# Patient Record
Sex: Female | Born: 1965 | Race: Black or African American | Hispanic: No | Smoking: Never smoker
Health system: Southern US, Community
[De-identification: ages and names within clinical notes are randomized; demographics above are authoritative.]

## PROBLEM LIST (undated history)

## (undated) DIAGNOSIS — E785 Hyperlipidemia, unspecified: Secondary | ICD-10-CM

## (undated) DIAGNOSIS — R002 Palpitations: Secondary | ICD-10-CM

## (undated) DIAGNOSIS — F419 Anxiety disorder, unspecified: Secondary | ICD-10-CM

## (undated) DIAGNOSIS — G4733 Obstructive sleep apnea (adult) (pediatric): Secondary | ICD-10-CM

## (undated) DIAGNOSIS — E876 Hypokalemia: Secondary | ICD-10-CM

## (undated) DIAGNOSIS — F329 Major depressive disorder, single episode, unspecified: Secondary | ICD-10-CM

## (undated) DIAGNOSIS — F4310 Post-traumatic stress disorder, unspecified: Secondary | ICD-10-CM

## (undated) DIAGNOSIS — M797 Fibromyalgia: Secondary | ICD-10-CM

## (undated) DIAGNOSIS — F32A Depression, unspecified: Secondary | ICD-10-CM

## (undated) DIAGNOSIS — E119 Type 2 diabetes mellitus without complications: Secondary | ICD-10-CM

## (undated) DIAGNOSIS — I10 Essential (primary) hypertension: Secondary | ICD-10-CM

## (undated) HISTORY — DX: Obstructive sleep apnea (adult) (pediatric): G47.33

## (undated) HISTORY — DX: Type 2 diabetes mellitus without complications: E11.9

## (undated) HISTORY — DX: Major depressive disorder, single episode, unspecified: F32.9

## (undated) HISTORY — PX: CHOLECYSTECTOMY: SHX55

## (undated) HISTORY — DX: Hyperlipidemia, unspecified: E78.5

## (undated) HISTORY — DX: Essential (primary) hypertension: I10

## (undated) HISTORY — PX: LAPAROSCOPIC GASTRIC RESTRICTIVE DUODENAL PROCEDURE (DUODENAL SWITCH): SHX6667

## (undated) HISTORY — DX: Palpitations: R00.2

## (undated) HISTORY — DX: Depression, unspecified: F32.A

## (undated) HISTORY — PX: BREAST REDUCTION SURGERY: SHX8

## (undated) HISTORY — DX: Fibromyalgia: M79.7

## (undated) HISTORY — DX: Hypokalemia: E87.6

## (undated) HISTORY — DX: Anxiety disorder, unspecified: F41.9

## (undated) HISTORY — DX: Post-traumatic stress disorder, unspecified: F43.10

---

## 2001-02-09 ENCOUNTER — Ambulatory Visit (HOSPITAL_COMMUNITY): Admission: RE | Admit: 2001-02-09 | Discharge: 2001-02-09 | Payer: Self-pay | Admitting: Obstetrics

## 2001-02-09 ENCOUNTER — Encounter: Payer: Self-pay | Admitting: Obstetrics

## 2005-05-21 ENCOUNTER — Ambulatory Visit (HOSPITAL_COMMUNITY): Admission: RE | Admit: 2005-05-21 | Discharge: 2005-05-21 | Payer: Self-pay | Admitting: Obstetrics

## 2006-01-07 ENCOUNTER — Encounter: Payer: Self-pay | Admitting: Emergency Medicine

## 2006-01-08 ENCOUNTER — Inpatient Hospital Stay (HOSPITAL_COMMUNITY): Admission: AD | Admit: 2006-01-08 | Discharge: 2006-01-09 | Payer: Self-pay | Admitting: Internal Medicine

## 2006-01-15 ENCOUNTER — Ambulatory Visit: Payer: Self-pay | Admitting: Internal Medicine

## 2007-09-10 IMAGING — CR DG ABDOMEN ACUTE W/ 1V CHEST
4 series · 4 of 4 positions shown · non-contrast
Comparison: None.

CLINICAL DATA: pain and vomiting

[w chest pa]
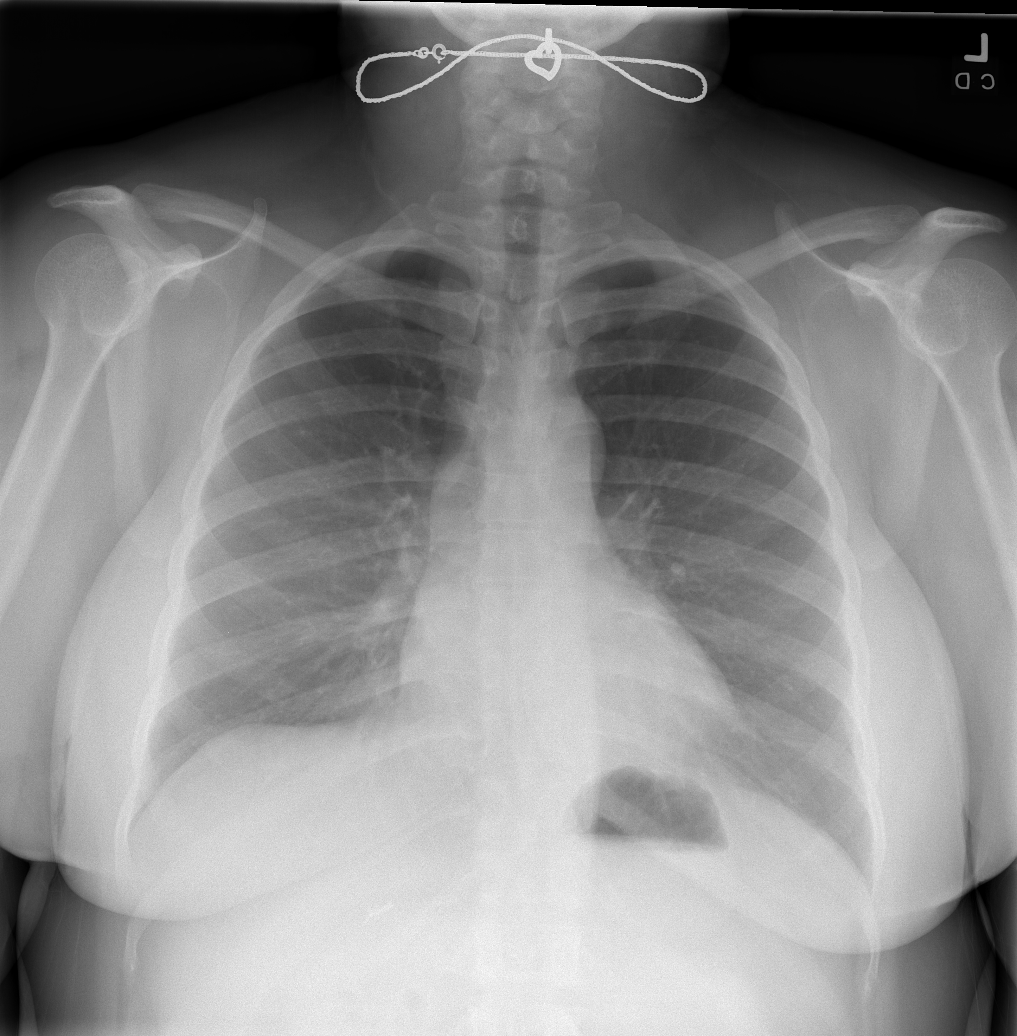

[w abdomen upright *]
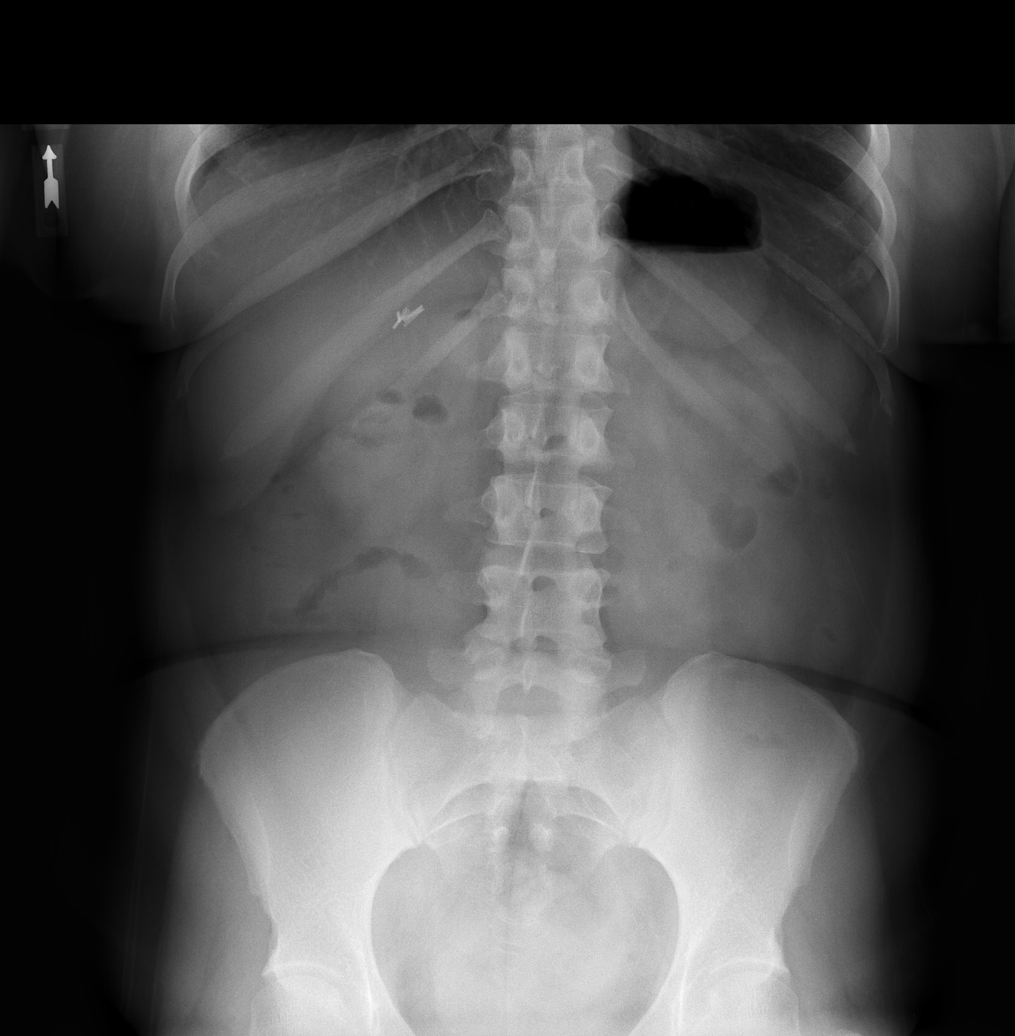

[t abdomen supine (1 of 2)]
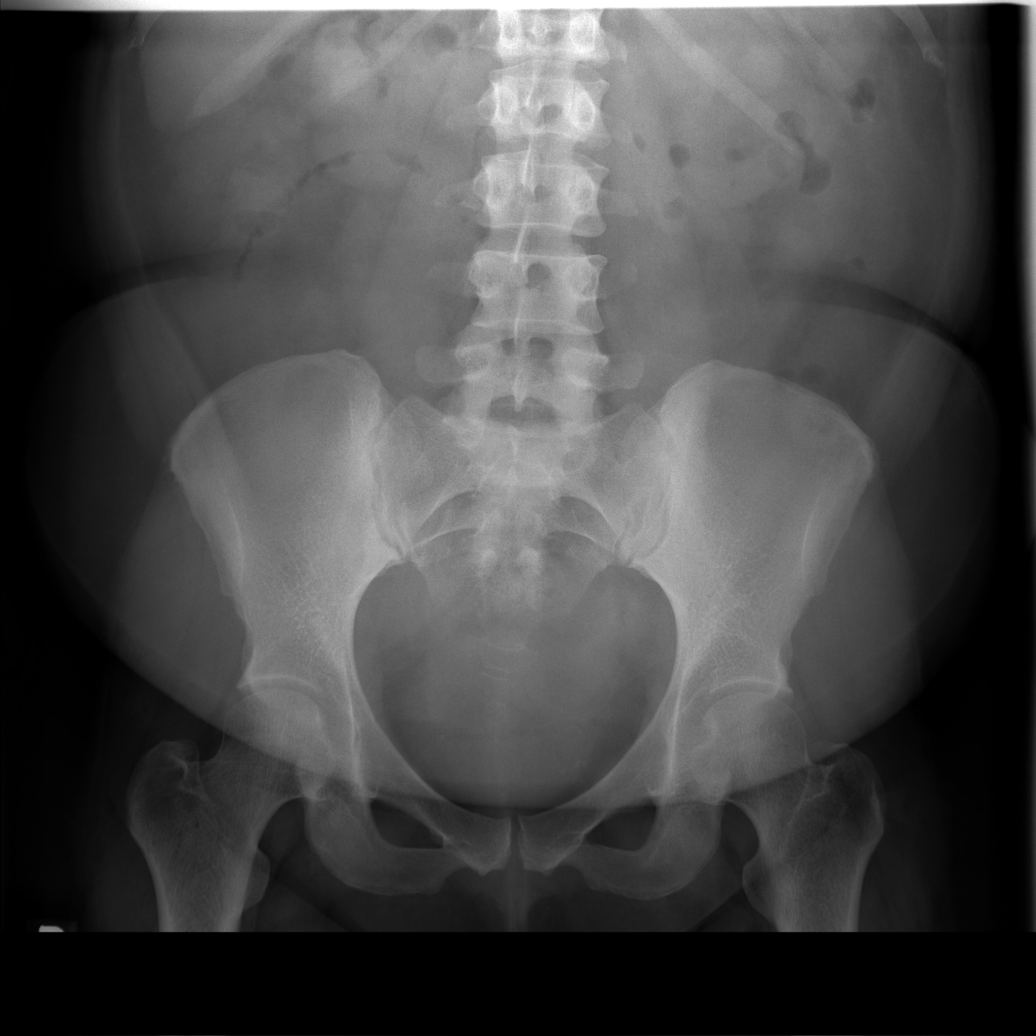

[t abdomen supine (2 of 2)]
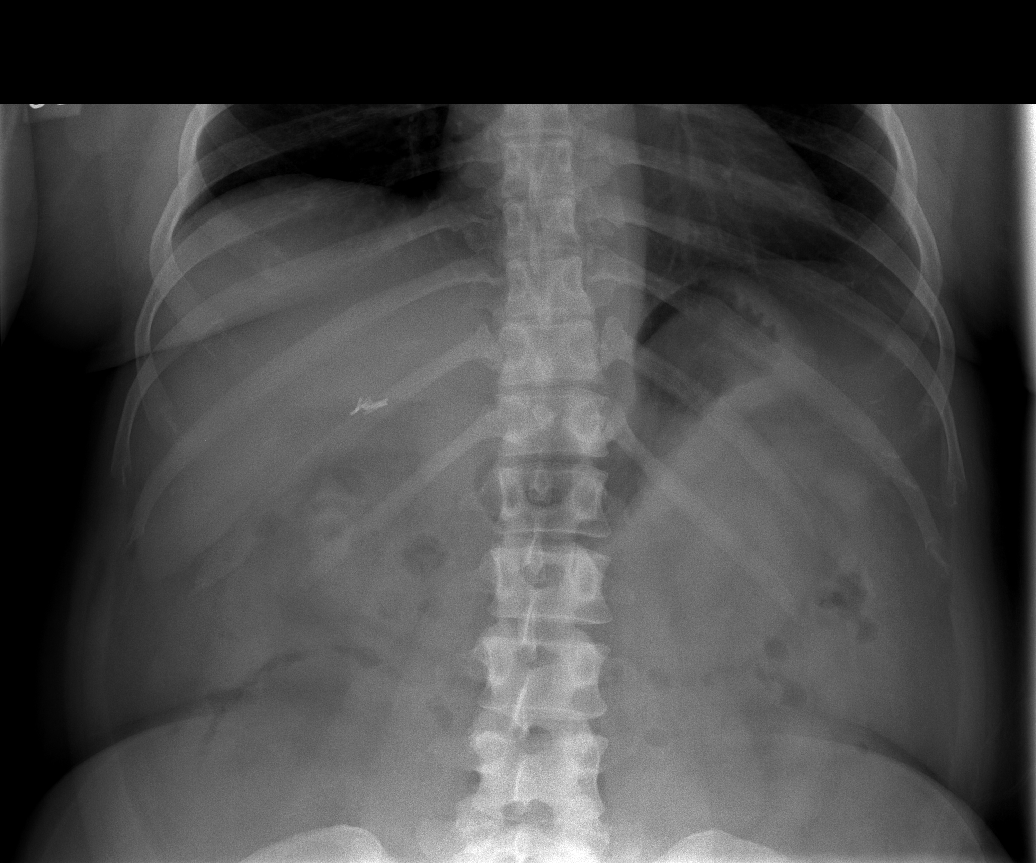

[4 of 4 positions shown; findings below may reference images not displayed]

ABDOMEN SERIES - 2 VIEW & CHEST - 1 VIEW:

Upright chest shows clear lungs bilaterally.  The cardiopericardial silhouette
is within normal limits for size.

Supine and upright views of the abdomen are without evidence for intraperitoneal
free air.  Bowel gas pattern is nonspecific.  Imaged bony structures are
unremarkable.

Surgical clips in the right upper quadrant suggest prior cholecystectomy.
IMPRESSION: No acute cardiopulmonary process.

Nonspecific bowel gas pattern.

## 2009-01-02 ENCOUNTER — Ambulatory Visit: Payer: Self-pay | Admitting: Family Medicine

## 2009-01-02 DIAGNOSIS — I10 Essential (primary) hypertension: Secondary | ICD-10-CM | POA: Insufficient documentation

## 2009-01-02 DIAGNOSIS — M25579 Pain in unspecified ankle and joints of unspecified foot: Secondary | ICD-10-CM

## 2009-01-02 DIAGNOSIS — E669 Obesity, unspecified: Secondary | ICD-10-CM

## 2009-01-16 ENCOUNTER — Encounter (INDEPENDENT_AMBULATORY_CARE_PROVIDER_SITE_OTHER): Payer: Self-pay | Admitting: *Deleted

## 2009-01-17 ENCOUNTER — Ambulatory Visit: Payer: Self-pay | Admitting: Internal Medicine

## 2009-01-20 ENCOUNTER — Telehealth (INDEPENDENT_AMBULATORY_CARE_PROVIDER_SITE_OTHER): Payer: Self-pay | Admitting: *Deleted

## 2009-01-23 DIAGNOSIS — K5090 Crohn's disease, unspecified, without complications: Secondary | ICD-10-CM

## 2009-01-23 LAB — CONVERTED CEMR LAB
BUN: 17 mg/dL (ref 6–23)
CO2: 29 meq/L (ref 19–32)
Calcium: 9.2 mg/dL (ref 8.4–10.5)
Chloride: 102 meq/L (ref 96–112)
Creatinine, Ser: 1.1 mg/dL (ref 0.4–1.2)
GFR calc non Af Amer: 69.07 mL/min (ref >60)
Glucose, Bld: 88 mg/dL (ref 70–99)
Potassium: 3.2 meq/L — ABNORMAL LOW (ref 3.5–5.1)
Sodium: 137 meq/L (ref 135–145)

## 2009-03-24 ENCOUNTER — Encounter: Payer: Self-pay | Admitting: Family Medicine

## 2009-03-24 ENCOUNTER — Ambulatory Visit: Payer: Self-pay | Admitting: Family Medicine

## 2009-05-19 ENCOUNTER — Telehealth: Payer: Self-pay | Admitting: Family Medicine

## 2009-05-29 ENCOUNTER — Ambulatory Visit: Payer: Self-pay | Admitting: Family Medicine

## 2010-05-12 ENCOUNTER — Encounter: Payer: Self-pay | Admitting: Obstetrics

## 2010-05-24 NOTE — Progress Notes (Signed)
Summary: refill  Phone Note Refill Request Message from:  Fax from Pharmacy on [address] fax [phone]  Refills Requested: Medication #1:  PHENTERMINE HCL 37.5 MG CAPS 1 tab by mouth QAM.. patient has a pending appt on feb 7,2011 for a cpx  Initial call taken by: Barb Merino,  May 19, 2009 8:38 AM  Follow-up for Phone Call        no refills on phenteramine without appointment Follow-up by: Loreen Freud DO,  May 19, 2009 12:32 PM  Additional Follow-up for Phone Call Additional follow up Details #1::        LMTCB. Army Fossa CMA  May 19, 2009 1:50 PM     Additional Follow-up for Phone Call Additional follow up Details #2::    pt is aware. Army Fossa CMA  May 19, 2009 2:23 PM

## 2010-09-07 NOTE — H&P (Signed)
NAMEDORMA, ALTMAN NO.:  [id number]   MEDICAL RECORD NO.:  [id number]          PATIENT TYPE:  EMS   LOCATION:  ED                           FACILITY:  [facility]   PHYSICIAN:  Lucita Ferrara, MD         DATE OF BIRTH:  1966/03/01   DATE OF ADMISSION:  01/07/2006  DATE OF DISCHARGE:                                HISTORY & PHYSICAL   HISTORY OF PRESENT ILLNESS:  The patient is a 45 year old female with a past  medical history significant for hypertension and cholecystitis, status post  cholecystectomy in 1998.  She presents with 2 days of mid-abdominal pain  that is around her navel area and kind located in a band across her abdomen.  The pain is at a 10/10 in intensity.  It is accompanied by nausea and  vomiting.  She has had intractable vomiting throughout the day, more than 6  times.  Vomitus is nonbloody; it is nonbilious.  She also complains of  diarrhea that is also nonbloody or black.  She does admit that she has had  black stools in the last couple of months on 2 occasions and she says there  was mixed blood in the stools.   REVIEW OF SYSTEMS:  Otherwise negative.  She denies fever or chills or  malaise.  She denies unintentional weight loss.  She denies headaches,  dizziness, visual changes, seizures, loss of consciousness, numbness,  tingling or tremors.  She denies cough, hemoptysis, chest pain or shortness  of breath.  She denies any changes in her bladder habits or dysuria or  frequency.  She denies bleeding or bruising tendencies.   SURGERIES:  1. She has had a gallbladder surgery in 1998.  2. She has had a C-section for her delivery.   SOCIAL HISTORY:  She denies drugs, alcohol or tobacco.  Last menstrual  period was December 24, 2005.   FAMILY HISTORY:  Noncontributory.   PHYSICAL EXAMINATION:  VITAL SIGNS:  Blood pressure was 130/93, pulse 90,  respirations 20, temperature 97.3, pulse oximetry 99% on room air.  GENERAL:  The patient is in no  acute distress.  HEENT:  PERRLA.  Extraocular muscles intact.  Sclerae are clear.  Disks  flat.  No nystagmus.  Tympanic membranes are clear, landmarks visible.  Posterior pharynx clear.  Oral mucosa without lesions.  NECK:  Supple.  The thyroid is not enlarged or tender.  No lymphadenopathy.  No JVD.  No carotid bruits appreciated.  CHEST:  Clear to auscultation bilaterally.  No wheezes.  HEART:  S1 and S2.  Regular rate and rhythm.  No murmurs, rubs or clicks.  ABDOMEN:  Soft.  There was diffuse tenderness in the mid-abdominal area  around the navel area on both side, I would say right lower quadrant and  left lower quadrant and bandlike.  There were no appreciable masses nor  organomegaly.  EXTREMITIES:  No clubbing, cyanosis, or edema.  Pulses 2+ bilaterally.  NEUROLOGIC:  Alert and oriented x3.  Cranial nerves II-XII grossly intact.  Cerebellar:  Finger-to-nose and heel-to-shin normal.  Gait steady.  Deep  tendon reflexes 2+ and equal.  Motor strength is 5/5 in all extremities.   LABORATORY DATA:  White blood cell count of 12.4, hemoglobin 12.7,  hematocrit 37.2, platelets 418,000.  AST 19, ALT 21, alkaline phosphatase 54  and total bilirubin 0.6, direct bilirubin 0.1, indirect bilirubin 0.5.  Amylase and lipase were drawn.   RADIOLOGIC FINDINGS:  X-ray of the abdomen with chest shows no acute  cardiopulmonary process, nonspecific bowel gas pattern.   EMERGENCY DEPARTMENT COURSE:  In the emergency room, the patient was given a  bolus of 250 mL of saline, started on Dilaudid hydromorphone 1 mg IV  piggyback, also on Zofran 4 mg IV piggyback.  The patient, in the emergency  room, actually did feel better.  Her pain intensity improved and went down.   CAT scan of the abdomen showed most likely infectious enteritis.  There is  no abscess.  There is no free air.  There is no fistula.  There is no  inflammation of the colon or the terminal ileum.  The vessels did not look  ischemic;  this is per the radiologist with whom I spoke.  No diverticulitis.   ASSESSMENT AND PLAN:  This is a 45 year old female with a past medical  history significant for hypertension, who presents with nausea, vomiting and  abdominal pain, some infectious versus inflammatory changes found on the CAT  scan.  We will go ahead and admit her for intravenous hydration.  We will  replete electrolytes as needed.  We will control her pain with Dilaudid  intravenously.  We will control nausea with Zofran.  We will go ahead and  start her on ciprofloxacin and Flagyl and she may need to get a  Gastroenterology consult for colonoscopy.  For her hypertension, we will go  ahead and continue her medications including the lisinopril and  hydrochlorothiazide.  I have explained the treatment plan to the patient and  the hospital course and outlook to the patient and the patient understands.      Lucita Ferrara, MD  Electronically Signed     RR/MEDQ  D:  01/08/2006  T:  01/08/2006  Job:  [id number]

## 2010-09-07 NOTE — Discharge Summary (Signed)
[name], Bonnie Fox                 ACCOUNT NO.:  [id number]   MEDICAL RECORD NO.:  [id number]          PATIENT TYPE:  INP   LOCATION:  6712                         FACILITY:  [facility]   PHYSICIAN:  Corinna L. Lendell Caprice, MDDATE OF BIRTH:  Aug 27, 1965   DATE OF ADMISSION:  01/08/2006  DATE OF DISCHARGE:  01/09/2006                                 DISCHARGE SUMMARY   DISCHARGE DIAGNOSES:  1. Enteritis.  2. Hypertension.  3. Hypokalemia.   DISCHARGE MEDICATIONS:  1. Hold lisinopril.  2. Vicodin 5/500 1-2 p.o. q.6h p.r.n. pain.  3. Levbid 0.375 mg p.o. b.i.d. p.r.n. cramping.  4. Protonix 40 mg a day.  5. Continue birth control pill.   CONDITION:  Stable.   ACTIVITY:  Ad lib.   DIET:  As tolerated.   She may return to work on January 13, 2006.  Follow up with Dr. Yancey Flemings  on October 8 at 9:00 a.m.   CONSULTATIONS:  Dr. Yancey Flemings.   PROCEDURES:  None.   PERTINENT LABORATORY DATA:  Initial white blood cell count was 12.4 with 86%  neutrophils, 12% lymphocytes, otherwise unremarkable CBC.  PT/PTT normal.  Complete metabolic panel essentially unremarkable on admission. Her  potassium dropped to 3.3 and was repleted. Amylase and lipase normal.  A  urine pregnancy negative.  UA showed trace ketones, specific gravity 1.034,  30 protein, negative nitrite, small leukocyte esterase, 36 white cells.   SPECIAL STUDIES AND RADIOLOGY:  Acute abdominal series showed nonspecific  bowel gas pattern and no acute cardiopulmonary process.  CT of the abdomen  and pelvis showed a fairly long segment of abnormal small bowel wall  thickening in the right abdomen associated with free fluid, some adhesions,  enteritis, inflammatory, infectious versus ischemic are considerations.   HISTORY AND HOSPITAL COURSE:  Ms. Bonnie Fox is a pleasant, 45 year old, black  female, unassigned patient who presented with severe abdominal pain and  diarrhea.  She is status post cholecystectomy in 1998.  She also had  some  nausea and vomiting. She had normal vital signs and diffuse tenderness but a  soft belly. CAT scan showed the nonspecific enteritis.  She was made n.p.o.  and started empirically on IV Cipro and Flagyl. Initially she improved but  when her diet was resumed, she have return of the diarrhea and cramping  although it had improved somewhat. GI was consulted and felt that this was  most likely viral enteritis. Also within the differential is bowel wall  edema from ACE inhibitor but this was felt to be less likely. Dr. Marina Goodell did  recommend holding the ACE inhibitor for now.  She apparently also told him  of some reflux symptoms and he recommend a proton pump inhibitor. He reports  that she is stable for discharge and she prefers to be discharged.      Corinna L. Lendell Caprice, MD  Electronically Signed     CLS/MEDQ  D:  01/09/2006  T:  01/10/2006  Job:  [id number]

## 2011-07-11 ENCOUNTER — Encounter: Payer: Self-pay | Admitting: Pulmonary Disease

## 2011-07-12 ENCOUNTER — Encounter: Payer: Self-pay | Admitting: Pulmonary Disease

## 2011-07-12 ENCOUNTER — Ambulatory Visit (INDEPENDENT_AMBULATORY_CARE_PROVIDER_SITE_OTHER): Payer: BC Managed Care – PPO | Admitting: Pulmonary Disease

## 2011-07-12 VITALS — BP 110/80 | HR 99 | Temp 98.4°F | Ht 62.0 in | Wt 222.4 lb

## 2011-07-12 DIAGNOSIS — G4733 Obstructive sleep apnea (adult) (pediatric): Secondary | ICD-10-CM

## 2011-07-12 DIAGNOSIS — R0683 Snoring: Secondary | ICD-10-CM

## 2011-07-12 DIAGNOSIS — R0609 Other forms of dyspnea: Secondary | ICD-10-CM

## 2011-07-12 HISTORY — DX: Obstructive sleep apnea (adult) (pediatric): G47.33

## 2011-07-12 NOTE — Assessment & Plan Note (Signed)
She has snoring, witnessed apnea, sleep disruption, and daytime sleepiness.  She has history of hypertension.  I am worried that she could have sleep apnea.   I have explained how sleep apnea can affect the patient's health.  Driving precautions and importance of weight loss were discussed.  Treatment options for sleep apnea were reviewed.  To further assess will arrange for in lab sleep study.

## 2011-07-12 NOTE — Progress Notes (Deleted)
  Subjective:    Patient ID: Bonnie Fox, female    DOB: Oct 04, 1965, 46 y.o.   MRN: [id number]  HPI    Review of Systems  Constitutional: Negative for appetite change and unexpected weight change.  HENT: Negative for sore throat, trouble swallowing and dental problem.   Respiratory: Positive for cough.   Cardiovascular: Positive for leg swelling.  Gastrointestinal: Negative for abdominal pain.  Musculoskeletal: Positive for joint swelling.  Skin: Positive for rash.  Neurological: Negative for headaches.  Psychiatric/Behavioral: Positive for dysphoric mood. The patient is nervous/anxious.        Objective:   Physical Exam        Assessment & Plan:

## 2011-07-12 NOTE — Progress Notes (Signed)
Chief Complaint  Patient presents with  . Sleep Consult    Pt c/o severe snoring and has been told she stops breathing at night    History of Present Illness: Bonnie Fox is a 46 y.o. female for evaluation of snoring.  She has notice problems with her sleep for years.  She is followed by psychiatry for PTSD.  She was advised to have sleep evaluation due to snoring, and daytime sleepiness.  She gets up for work at 4 am.  She goes to work at 6 am, and home at 4 pm.  She will nap from 530 pm until 7 pm.  She then goes to bed at 10 pm.  She will occasionally take trazodone, but this does not help keep her asleep.  She drink lots of coffee during the day.  She denies headaches in the morning.  She feels tired all the time.  She will talk in her sleep.  She gets pains in her legs from neuropathy and fibromyalgia.   The patient denies sleep walking, sleep talking, bruxism, or nightmares.  There is no history of restless legs.  The patient denies sleep hallucinations, sleep paralysis, or cataplexy.  Her Epworth score is 18 out of 24.  Past Medical History  Diagnosis Date  . Hypertension   . PTSD (post-traumatic stress disorder)   . Anxiety   . Depression     Past Surgical History  Procedure Date  . Cesarean section   . Breast reduction surgery   . Cholecystectomy     Current Outpatient Prescriptions on File Prior to Visit  Medication Sig Dispense Refill  . lisinopril-hydrochlorothiazide (PRINZIDE,ZESTORETIC) 20-25 MG per tablet Take 1 tablet by mouth daily.        Allergies  Allergen Reactions  . Ibuprofen   . Latex     family history includes Diabetes in her father and mother; Hypertension in her mother; Kidney disease in her father; and Prostate cancer in her father.   reports that she has never smoked. She does not have any smokeless tobacco history on file. She reports that she does not drink alcohol or use illicit drugs.  Review of Systems  Constitutional:  Negative for appetite change and unexpected weight change.  HENT: Negative for sore throat, trouble swallowing and dental problem.   Respiratory: Positive for cough.   Cardiovascular: Positive for leg swelling.  Gastrointestinal: Negative for abdominal pain.  Musculoskeletal: Positive for joint swelling.  Skin: Positive for rash.  Neurological: Negative for headaches.  Psychiatric/Behavioral: Positive for dysphoric mood. The patient is nervous/anxious.     Physical Exam: BP 110/80  Pulse 99  Temp(Src) 98.4 F (36.9 C) (Oral)  Ht 5\' 2"  (1.575 m)  Wt 222 lb 6.4 oz (100.88 kg)  BMI 40.68 kg/m2  SpO2 97% Body mass index is 40.68 kg/(m^2).  General - Obese HEENT - PERRLA, EOMI, no sinus tenderness, no oral exudate, no LAN, no thyromegaly Cardiac - s1s2 regular, no murmur Chest - no wheeze/rales/dullness Abdomen - soft, non-tender, no organomegaly Extremities - no edema/cyanosis/clubbing Neurologic - normal strength, CN intact Skin - no rashes Psychiatric - normal mood, behavior  Assessment/Plan:  Outpatient Encounter Prescriptions as of 07/12/2011  Medication Sig Dispense Refill  . citalopram (CELEXA) 40 MG tablet Take 40 mg by mouth daily.      . clonazePAM (KLONOPIN) 0.5 MG tablet Take 0.5 mg by mouth 2 (two) times daily.      . DULoxetine (CYMBALTA) 30 MG capsule Take 30 mg by mouth  2 (two) times daily.      . hydrOXYzine (VISTARIL) 25 MG capsule Take 25 mg by mouth daily.      Marland Kitchen lisinopril-hydrochlorothiazide (PRINZIDE,ZESTORETIC) 20-25 MG per tablet Take 1 tablet by mouth daily.      Marland Kitchen omeprazole (PRILOSEC) 20 MG capsule Take 20 mg by mouth 2 (two) times daily.      . traZODone (DESYREL) 50 MG tablet Take 50 mg by mouth at bedtime.      Marland Kitchen DISCONTD: diclofenac (VOLTAREN) 75 MG EC tablet 1-2 daily as needed for pain      . DISCONTD: phentermine 37.5 MG capsule Take 37.5 mg by mouth daily.        Rhyli Depaula Pager:  [phone] 07/12/2011, 2:41 PM

## 2011-07-12 NOTE — Patient Instructions (Signed)
Will schedule sleep test Will call to schedule follow up after sleep test reviewed 

## 2011-07-22 ENCOUNTER — Ambulatory Visit (HOSPITAL_BASED_OUTPATIENT_CLINIC_OR_DEPARTMENT_OTHER): Payer: BC Managed Care – PPO | Attending: Pulmonary Disease | Admitting: Radiology

## 2011-07-22 VITALS — Ht 62.0 in | Wt 220.0 lb

## 2011-07-22 DIAGNOSIS — Z79899 Other long term (current) drug therapy: Secondary | ICD-10-CM | POA: Insufficient documentation

## 2011-07-22 DIAGNOSIS — R0683 Snoring: Secondary | ICD-10-CM

## 2011-07-22 DIAGNOSIS — G4733 Obstructive sleep apnea (adult) (pediatric): Secondary | ICD-10-CM | POA: Insufficient documentation

## 2011-07-22 DIAGNOSIS — I10 Essential (primary) hypertension: Secondary | ICD-10-CM | POA: Insufficient documentation

## 2011-08-05 ENCOUNTER — Telehealth: Payer: Self-pay | Admitting: Pulmonary Disease

## 2011-08-05 DIAGNOSIS — R0683 Snoring: Secondary | ICD-10-CM

## 2011-08-05 NOTE — Telephone Encounter (Signed)
lmomtcb x1 

## 2011-08-05 NOTE — Telephone Encounter (Signed)
Dr. Craige Cotta do you have the pt sleep study? Please advise. Carron Curie, CMA

## 2011-08-07 ENCOUNTER — Encounter: Payer: Self-pay | Admitting: Pulmonary Disease

## 2011-08-07 NOTE — Telephone Encounter (Signed)
Pt aware and appt set for 08-30-11. Carron Curie, CMA

## 2011-08-07 NOTE — Telephone Encounter (Signed)
VS's first available is 5.10.13 - LMOM TCB x1.

## 2011-08-07 NOTE — Telephone Encounter (Signed)
PSG 07/22/11>>AHI 8.9, RDI 21.8, SpO2 low 90%.  Positional and REM effect.  Please inform patient that she has mild sleep apnea.  She will need ROV to discuss in more detail.  Please schedule her for next available ROV.

## 2011-08-08 DIAGNOSIS — Z79899 Other long term (current) drug therapy: Secondary | ICD-10-CM

## 2011-08-08 DIAGNOSIS — G4733 Obstructive sleep apnea (adult) (pediatric): Secondary | ICD-10-CM

## 2011-08-08 DIAGNOSIS — I10 Essential (primary) hypertension: Secondary | ICD-10-CM

## 2011-08-08 NOTE — Procedures (Signed)
Bonnie Fox, KLAAS NO.:  [id number]  MEDICAL RECORD NO.:  [id number]          PATIENT TYPE:  OUT  LOCATION:  SLEEP CENTER                 [facility]ACILITY[facility]  PHYSICIAN:  Coralyn Helling, MD        DATE OF BIRTH:  1965/09/12  DATE OF STUDY:  07/22/2011                           NOCTURNAL POLYSOMNOGRAM  REFERRING PHYSICIAN:  Coralyn Helling, MD  FACILITY:  [facility]  INDICATION FOR STUDY:  [name] is a 46 year old female who has a history of hypertension.  She also reports snoring, sleep disruption, witnessed apnea, and daytime sleepiness.  She is, therefore, referred to the sleep lab for evaluation of hypersomnia with obstructive sleep apnea.  Height is 5 feet 2 inches, weight is 220 pounds, BMI is 40, neck size 15 inches.  EPWORTH SLEEPINESS SCORE:  18.  MEDICATIONS:  Cymbalta, lisinopril, hydrazine, trazodone, and omeprazole.  SLEEP ARCHITECTURE:  Total recording time was 367 minutes.  Total sleep time was 338 minutes.  Sleep efficiency was 92%.  Sleep latency was 6 minutes.  REM latency was 150 minutes. The patient was observed in all stages of sleep, and she slept predominantly in the supine position  RESPIRATORY DATA:  The average respiratory rate was 15.  Moderate snoring was noted by the technician.  The overall apnea-hypopnea index was 8.9.  The respiratory disturbance index was 21.8.  The events were exclusively obstructive in nature.  The REM apnea-hypopnea index was 13.7, and the supine apnea-hypopnea index was 8.6.  OXYGEN DATA:  The baseline oxygenation was 99%.  The oxygen saturation nadir was 90%.  The patient did not require the use of supplemental oxygen during the study.  CARDIAC DATA:  The average heart rate is 91, and the rhythm strip showed normal sinus rhythm.  MOVEMENT-PARASOMNIA:  The patient had 1 restroom trip, and the periodic limb movement index was 0.  IMPRESSIONS-RECOMMENDATIONS:  This study shows evidence for  mild-to- moderate obstructive sleep apnea with an overall apnea-hypopnea index of 8.9 and oxygen saturation nadir of 90%.  She did have a significant positional and REM effect to her sleep- disordered breathing.  In addition to diet, excise, and weight reduction, additional therapeutic interventions could include continuous positive airway pressure therapy, oral appliance, or surgical intervention.     Coralyn Helling, MD Diplomat, American Board of Sleep Medicine    VS/MEDQ  D:  08/07/2011 14:13:15  T:  08/08/2011 00:50:11  Job:  [id number]

## 2011-08-30 ENCOUNTER — Encounter: Payer: Self-pay | Admitting: Pulmonary Disease

## 2011-08-30 ENCOUNTER — Ambulatory Visit (INDEPENDENT_AMBULATORY_CARE_PROVIDER_SITE_OTHER): Payer: BC Managed Care – PPO | Admitting: Pulmonary Disease

## 2011-08-30 VITALS — BP 118/76 | HR 98 | Temp 97.8°F | Ht 62.0 in | Wt 224.0 lb

## 2011-08-30 DIAGNOSIS — G4733 Obstructive sleep apnea (adult) (pediatric): Secondary | ICD-10-CM

## 2011-08-30 NOTE — Progress Notes (Signed)
Chief Complaint  Patient presents with  . Follow-up    Pt here to discuss sleep study results    History of Present Illness: Bonnie Fox is a 46 y.o. female sleep apnea.  She is here to review PSG 07/22/11>>AHI 8.9, RDI 21.8, SpO2 low 90%. Positional and REM effect.  She has requested records be sent to Dr. Owens Loffler [phone]).  She continues to have trouble with her sleep.    Past Medical History  Diagnosis Date  . Hypertension   . PTSD (post-traumatic stress disorder)   . Anxiety   . Depression   . OSA (obstructive sleep apnea) 07/12/2011    PSG 07/22/11>>AHI 8.9, RDI 21.8, SpO2 low 90%.  Positional and REM effect.     Past Surgical History  Procedure Date  . Cesarean section   . Breast reduction surgery   . Cholecystectomy     Allergies  Allergen Reactions  . Ibuprofen   . Latex     Physical Exam:  Blood pressure 118/76, pulse 98, temperature 97.8 F (36.6 C), temperature source Oral, height 5\' 2"  (1.575 m), weight 224 lb (101.606 kg), SpO2 95.00%. Body mass index is 40.97 kg/(m^2). Wt Readings from Last 2 Encounters:  08/30/11 224 lb (101.606 kg)  07/22/11 220 lb (99.791 kg)    General - Obese  HEENT - PERRLA, EOMI, no sinus tenderness, no oral exudate, no LAN, no thyromegaly  Cardiac - s1s2 regular, no murmur  Chest - no wheeze/rales/dullness  Abdomen - soft, non-tender, no organomegaly  Extremities - no edema/cyanosis/clubbing  Neurologic - normal strength, CN intact  Skin - no rashes  Psychiatric - normal mood, behavior   Assessment/Plan:  Outpatient Encounter Prescriptions as of 08/30/2011  Medication Sig Dispense Refill  . clonazePAM (KLONOPIN) 0.5 MG tablet Take 0.5 mg by mouth 2 (two) times daily.      . DULoxetine (CYMBALTA) 30 MG capsule Take 30 mg by mouth 2 (two) times daily.      . hydrochlorothiazide (HYDRODIURIL) 25 MG tablet 1 tablet twice a day      . hydrOXYzine (VISTARIL) 25 MG capsule Take 25 mg by mouth daily.       Marland Kitchen lisinopril-hydrochlorothiazide (PRINZIDE,ZESTORETIC) 20-25 MG per tablet Take 1 tablet by mouth daily.      Marland Kitchen omeprazole (PRILOSEC) 20 MG capsule Take 20 mg by mouth 2 (two) times daily.      Marland Kitchen DISCONTD: citalopram (CELEXA) 40 MG tablet Take 40 mg by mouth daily.      Marland Kitchen DISCONTD: traZODone (DESYREL) 50 MG tablet Take 50 mg by mouth at bedtime.        Delfina Schreurs Pager:  [phone] 08/30/2011, 2:48 PM

## 2011-08-30 NOTE — Patient Instructions (Signed)
Will arrange for CPAP set up Follow up in 2 months 

## 2011-08-30 NOTE — Assessment & Plan Note (Signed)
She has mild sleep apnea.    I have reviewed her sleep test results with the patient.  Explained how sleep apnea can affect the patient's health.  Driving precautions and importance of weight loss were discussed.  Treatment options for sleep apnea were reviewed.  Will arrange for auto CPAP at home.

## 2011-10-30 ENCOUNTER — Ambulatory Visit: Payer: BC Managed Care – PPO | Admitting: Pulmonary Disease

## 2011-12-03 ENCOUNTER — Ambulatory Visit: Payer: BC Managed Care – PPO | Admitting: Pulmonary Disease

## 2013-12-10 ENCOUNTER — Ambulatory Visit (INDEPENDENT_AMBULATORY_CARE_PROVIDER_SITE_OTHER): Payer: BC Managed Care – PPO | Admitting: Cardiovascular Disease

## 2013-12-10 ENCOUNTER — Telehealth: Payer: Self-pay | Admitting: Cardiovascular Disease

## 2013-12-10 ENCOUNTER — Encounter: Payer: Self-pay | Admitting: Cardiovascular Disease

## 2013-12-10 VITALS — BP 100/62 | HR 90 | Ht 62.0 in | Wt 214.5 lb

## 2013-12-10 DIAGNOSIS — E876 Hypokalemia: Secondary | ICD-10-CM | POA: Insufficient documentation

## 2013-12-10 DIAGNOSIS — I10 Essential (primary) hypertension: Secondary | ICD-10-CM

## 2013-12-10 DIAGNOSIS — E782 Mixed hyperlipidemia: Secondary | ICD-10-CM

## 2013-12-10 DIAGNOSIS — Z79899 Other long term (current) drug therapy: Secondary | ICD-10-CM

## 2013-12-10 DIAGNOSIS — E785 Hyperlipidemia, unspecified: Secondary | ICD-10-CM | POA: Insufficient documentation

## 2013-12-10 LAB — LIPID PANEL
CHOL/HDL RATIO: 4.7 ratio
Cholesterol: 259 mg/dL — ABNORMAL HIGH (ref 0–200)
HDL: 55 mg/dL (ref >39)
LDL Cholesterol: 176 mg/dL — ABNORMAL HIGH (ref 0–99)
Triglycerides: 139 mg/dL (ref <150)
VLDL: 28 mg/dL (ref 0–40)

## 2013-12-10 LAB — HEPATIC FUNCTION PANEL
ALK PHOS: 68 U/L (ref 39–117)
ALT: 17 U/L (ref 0–35)
AST: 17 U/L (ref 0–37)
Albumin: 4.7 g/dL (ref 3.5–5.2)
BILIRUBIN DIRECT: 0.1 mg/dL (ref 0.0–0.3)
BILIRUBIN TOTAL: 0.4 mg/dL (ref 0.2–1.2)
Indirect Bilirubin: 0.3 mg/dL (ref 0.2–1.2)
Total Protein: 7.9 g/dL (ref 6.0–8.3)

## 2013-12-10 LAB — BASIC METABOLIC PANEL
BUN: 13 mg/dL (ref 6–23)
CALCIUM: 9.7 mg/dL (ref 8.4–10.5)
CO2: 28 meq/L (ref 19–32)
CREATININE: 0.85 mg/dL (ref 0.50–1.10)
Chloride: 96 mEq/L (ref 96–112)
GLUCOSE: 81 mg/dL (ref 70–99)
Potassium: 3.2 mEq/L — ABNORMAL LOW (ref 3.5–5.3)
Sodium: 134 mEq/L — ABNORMAL LOW (ref 135–145)

## 2013-12-10 MED ORDER — ATORVASTATIN CALCIUM 20 MG PO TABS: 20.0000 mg | ORAL_TABLET | Freq: Every day | ORAL | Status: DC

## 2013-12-10 NOTE — Assessment & Plan Note (Signed)
Recent potassium level was measured at 3. She is on hydrochlorothiazide and lisinopril. I am going to check a repeat basic metabolic panel prior to prescribing potassium repletion.

## 2013-12-10 NOTE — Patient Instructions (Signed)
  We will see you back in follow up in 1 year with Dr Allyson SabalBerry.   Dr Allyson SabalBerry has ordered:  Start Lipitor (atorvastatin) 20mg  daily  Have blood work done today and in 2 months from now (October)

## 2013-12-10 NOTE — Assessment & Plan Note (Signed)
Controlled on current medications 

## 2013-12-10 NOTE — Telephone Encounter (Signed)
Received a call from [facility] they wanted to know what lab needed to be done .Advised bmet,lipid and hepatic panels.

## 2013-12-10 NOTE — Progress Notes (Signed)
12/10/2013 Bonnie Fox   1965-06-17  [id number]  Primary Physician No primary provider on file. Primary Cardiologist: Runell GessJonathan J. Makarios Madlock MD Roseanne RenoFACP,FACC,FAHA, FSCAI   HPI:  Ms. Bonnie Fox is a 48 year old severely overweight African American female mother of one child works at [employer] .  Her [facility] provided by [facility]. She is self referred for cardiovascular evaluation because of low potassium, high cholesterol and an abnormal EKG that she was told she had. Her cardiovascular risk factor profile is notable for 2 hypertension and hyper lipidemia. There is no family history. She does not smoke. She's never had an attack or stroke and denies chest pain or shortness of breath. She does have obstructive sleep apnea on CPAP. Recent lipid profile [facility] at [facility] 12/02/13 2 cholesterol 235 and LDL 154 HDL 56   Current Outpatient Prescriptions  Medication Sig Dispense Refill  . DULoxetine (CYMBALTA) 60 MG capsule Take 60 mg by mouth daily.      . hydrochlorothiazide (HYDRODIURIL) 25 MG tablet Take 25 mg by mouth daily.       . hydrOXYzine (VISTARIL) 50 MG capsule Take 50 mg by mouth 3 (three) times daily as needed.      Marland Kitchen. lisinopril (PRINIVIL,ZESTRIL) 40 MG tablet Take 20 mg by mouth daily.      Marland Kitchen. atorvastatin (LIPITOR) 20 MG tablet Take 1 tablet (20 mg total) by mouth daily.  30 tablet  6  . omeprazole (PRILOSEC) 20 MG capsule Take 20 mg by mouth as needed (indigestion).        No current facility-administered medications for this visit.    Allergies  Allergen Reactions  . Ibuprofen Swelling and Rash  . Latex Swelling and Rash  . Penicillins Swelling and Rash    History   Social History  . Marital Status: Legally Separated    Spouse Name: N/A    Number of Children: 1  . 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 Smoking status: Never Smoker   . Smokeless  tobacco: Not on file  . Alcohol Use: No  . Drug Use: No  . Sexual Activity: Not on file   Other Topics Concern  . Not on file   Social History Narrative  . No narrative on file     Review of Systems: General: negative for chills, fever, night sweats or weight changes.  Cardiovascular: negative for chest pain, dyspnea on exertion, edema, orthopnea, palpitations, paroxysmal nocturnal dyspnea or shortness of breath Dermatological: negative for rash Respiratory: negative for cough or wheezing Urologic: negative for hematuria Abdominal: negative for nausea, vomiting, diarrhea, bright red blood per rectum, melena, or hematemesis Neurologic: negative for visual changes, syncope, or dizziness All other systems reviewed and are otherwise negative except as noted above.    Blood pressure 100/62, pulse 90, height 5\' 2"  (1.575 m), weight 214 lb 8 oz (97.297 kg).  General appearance: alert and no distress Neck: no adenopathy, no carotid bruit, no JVD, supple, symmetrical, trachea midline and thyroid not enlarged, symmetric, no tenderness/mass/nodules Lungs: clear to auscultation bilaterally Heart: regular rate and rhythm, S1, S2 normal, no murmur, click, rub or gallop Extremities: extremities normal, atraumatic, no cyanosis or edema and 2+ pedal pulses bilaterally  EKG normal sinus rhythm at 90 without ST or T wave changes  ASSESSMENT AND PLAN:   HYPERTENSION Controlled on current medications  Hypokalemia Recent potassium level  was measured at 3. She is on hydrochlorothiazide and lisinopril. I am going to check a repeat basic metabolic panel prior to prescribing potassium repletion.  Hyperlipidemia Recent lipid profile performed by the [facility] on 12/02/13 revealed a glucose of 235, LDL of 154 HDL 56. She does admit to dietary indiscretion eating fast food for lunch as well as fried chicken wings twice a week. We talked about dietary modification. I'm going to begin her on  atorvastatin 20 mg a day and we'll recheck a lipid and liver profile in 2 months      Runell Gess MD [facility], [facility] 12/10/2013 11:48 AM

## 2013-12-10 NOTE — Assessment & Plan Note (Signed)
Recent lipid profile performed by the [facility] on 12/02/13 revealed a glucose of 235, LDL of 154 HDL 56. She does admit to dietary indiscretion eating fast food for lunch as well as fried chicken wings twice a week. We talked about dietary modification. I'm going to begin her on atorvastatin 20 mg a day and we'll recheck a lipid and liver profile in 2 months

## 2013-12-13 ENCOUNTER — Telehealth: Payer: Self-pay | Admitting: *Deleted

## 2013-12-13 DIAGNOSIS — E876 Hypokalemia: Secondary | ICD-10-CM

## 2013-12-13 DIAGNOSIS — Z79899 Other long term (current) drug therapy: Secondary | ICD-10-CM

## 2013-12-13 DIAGNOSIS — E782 Mixed hyperlipidemia: Secondary | ICD-10-CM

## 2013-12-13 MED ORDER — POTASSIUM CHLORIDE CRYS ER 20 MEQ PO TBCR: 20.0000 meq | EXTENDED_RELEASE_TABLET | Freq: Every day | ORAL | Status: DC

## 2013-12-13 NOTE — Telephone Encounter (Signed)
ERX sent for [facility].  RX for atorvastatin is already at pharmacy awaiting pick up.  Lab slip for repeat BMP ordered to be drawn Monday 8/31 and repeat labs for lipids mailed to patient to be done in October

## 2013-12-13 NOTE — Telephone Encounter (Signed)
Message copied by Marella Bile on Mon Dec 13, 2013 10:34 AM ------      Message from: Runell Gess      Created: Sun Dec 12, 2013  4:58 PM       Start Statin as well as KDUR 20 meq and recheck ------

## 2014-05-20 ENCOUNTER — Encounter (HOSPITAL_COMMUNITY): Payer: Self-pay | Admitting: *Deleted

## 2014-05-20 ENCOUNTER — Emergency Department (HOSPITAL_COMMUNITY)
Admission: EM | Admit: 2014-05-20 | Discharge: 2014-05-20 | Disposition: A | Discharge status: Home / Self Care | Payer: BLUE CROSS/BLUE SHIELD | Attending: Emergency Medicine | Admitting: Emergency Medicine

## 2014-05-20 DIAGNOSIS — E785 Hyperlipidemia, unspecified: Secondary | ICD-10-CM | POA: Diagnosis not present

## 2014-05-20 DIAGNOSIS — Z9981 Dependence on supplemental oxygen: Secondary | ICD-10-CM | POA: Insufficient documentation

## 2014-05-20 DIAGNOSIS — R0789 Other chest pain: Secondary | ICD-10-CM | POA: Diagnosis not present

## 2014-05-20 DIAGNOSIS — I10 Essential (primary) hypertension: Secondary | ICD-10-CM | POA: Diagnosis not present

## 2014-05-20 DIAGNOSIS — E876 Hypokalemia: Secondary | ICD-10-CM | POA: Insufficient documentation

## 2014-05-20 DIAGNOSIS — Z91040 Latex allergy status: Secondary | ICD-10-CM | POA: Diagnosis not present

## 2014-05-20 DIAGNOSIS — G8929 Other chronic pain: Secondary | ICD-10-CM | POA: Insufficient documentation

## 2014-05-20 DIAGNOSIS — F329 Major depressive disorder, single episode, unspecified: Secondary | ICD-10-CM | POA: Diagnosis not present

## 2014-05-20 DIAGNOSIS — G4733 Obstructive sleep apnea (adult) (pediatric): Secondary | ICD-10-CM | POA: Insufficient documentation

## 2014-05-20 DIAGNOSIS — M7918 Myalgia, other site: Secondary | ICD-10-CM

## 2014-05-20 DIAGNOSIS — M791 Myalgia: Secondary | ICD-10-CM | POA: Insufficient documentation

## 2014-05-20 DIAGNOSIS — F4310 Post-traumatic stress disorder, unspecified: Secondary | ICD-10-CM | POA: Diagnosis not present

## 2014-05-20 DIAGNOSIS — Z79899 Other long term (current) drug therapy: Secondary | ICD-10-CM | POA: Insufficient documentation

## 2014-05-20 DIAGNOSIS — Z880 Allergy status to penicillin: Secondary | ICD-10-CM | POA: Diagnosis not present

## 2014-05-20 DIAGNOSIS — F419 Anxiety disorder, unspecified: Secondary | ICD-10-CM | POA: Diagnosis not present

## 2014-05-20 DIAGNOSIS — R002 Palpitations: Secondary | ICD-10-CM | POA: Diagnosis present

## 2014-05-20 LAB — BASIC METABOLIC PANEL
Anion gap: 8 (ref 5–15)
BUN: 16 mg/dL (ref 6–23)
CALCIUM: 9.1 mg/dL (ref 8.4–10.5)
CHLORIDE: 99 mmol/L (ref 96–112)
CO2: 29 mmol/L (ref 19–32)
Creatinine, Ser: 0.9 mg/dL (ref 0.50–1.10)
GFR calc Af Amer: 85 mL/min — ABNORMAL LOW (ref >90)
GFR calc non Af Amer: 73 mL/min — ABNORMAL LOW (ref >90)
GLUCOSE: 122 mg/dL — AB (ref 70–99)
Potassium: 3 mmol/L — ABNORMAL LOW (ref 3.5–5.1)
SODIUM: 136 mmol/L (ref 135–145)

## 2014-05-20 LAB — CBC WITH DIFFERENTIAL/PLATELET
Basophils Absolute: 0 10*3/uL (ref 0.0–0.1)
Basophils Relative: 0 % (ref 0–1)
Eosinophils Absolute: 0.2 10*3/uL (ref 0.0–0.7)
Eosinophils Relative: 2 % (ref 0–5)
HCT: 32.5 % — ABNORMAL LOW (ref 36.0–46.0)
Hemoglobin: 10.6 g/dL — ABNORMAL LOW (ref 12.0–15.0)
LYMPHS ABS: 3.4 10*3/uL (ref 0.7–4.0)
Lymphocytes Relative: 40 % (ref 12–46)
MCH: 32.1 pg (ref 26.0–34.0)
MCHC: 32.6 g/dL (ref 30.0–36.0)
MCV: 98.5 fL (ref 78.0–100.0)
MONO ABS: 0.7 10*3/uL (ref 0.1–1.0)
Monocytes Relative: 8 % (ref 3–12)
NEUTROS PCT: 50 % (ref 43–77)
Neutro Abs: 4.3 10*3/uL (ref 1.7–7.7)
Platelets: 345 10*3/uL (ref 150–400)
RBC: 3.3 MIL/uL — ABNORMAL LOW (ref 3.87–5.11)
RDW: 13.4 % (ref 11.5–15.5)
WBC: 8.6 10*3/uL (ref 4.0–10.5)

## 2014-05-20 LAB — URINALYSIS, ROUTINE W REFLEX MICROSCOPIC
BILIRUBIN URINE: NEGATIVE
Glucose, UA: NEGATIVE mg/dL
Hgb urine dipstick: NEGATIVE
Ketones, ur: NEGATIVE mg/dL
Nitrite: NEGATIVE
PROTEIN: NEGATIVE mg/dL
SPECIFIC GRAVITY, URINE: 1.023 (ref 1.005–1.030)
Urobilinogen, UA: 1 mg/dL (ref 0.0–1.0)
pH: 5.5 (ref 5.0–8.0)

## 2014-05-20 LAB — I-STAT TROPONIN, ED: Troponin i, poc: 0 ng/mL (ref 0.00–0.08)

## 2014-05-20 LAB — URINE MICROSCOPIC-ADD ON

## 2014-05-20 MED ORDER — MORPHINE SULFATE 4 MG/ML IJ SOLN
4.0000 mg | Freq: Once | INTRAMUSCULAR | Status: DC
  Filled 2014-05-20: qty 1

## 2014-05-20 MED ORDER — TRAMADOL HCL 50 MG PO TABS: 50.0000 mg | ORAL_TABLET | Freq: Four times a day (QID) | ORAL | Status: DC | PRN

## 2014-05-20 NOTE — ED Provider Notes (Signed)
CSN: [id number]     Arrival date & time 05/20/14  1351 History   First MD Initiated Contact with Patient 05/20/14 1800     Chief Complaint  Patient presents with  . Leg Pain  . Arm Pain  . Palpitations    HPI Pt has been having pain on the left side of her body, arm and leg.   She was diagnosed in 2011 with peripheral neuropathy.  In 2012 she was diagnosed with fibromyalgia.  She has tried several medications including lyric, gabapentin and other medications.  Whenever she stands for a while or is sitting for a while when she tries to move she will stiff on her side.  She also feels pins sticking in her foot as well as a sensation that ice cold fluid is being pored on her body.  [facility].  She is due to see a neurologist.  She was at work today and the symptoms were worse.  She decided to come to the ED.  Today she also felt her heart jumping and felt some sharp pain on the left side of her chest Past Medical History  Diagnosis Date  . Hypertension   . PTSD (post-traumatic stress disorder)   . Anxiety   . Depression   . OSA (obstructive sleep apnea) 07/12/2011    PSG 07/22/11>>AHI 8.9, RDI 21.8, SpO2 low 90%.  Positional and REM effect.   . Hypokalemia   . Hyperlipidemia    Past Surgical History  Procedure Laterality Date  . Cesarean section    . Breast reduction surgery    . Cholecystectomy     Family History  Problem Relation Age of Onset  . Diabetes Mother   . Diabetes Father   . Prostate cancer Father   . Hypertension Mother   . Kidney disease Father    History  Substance Use Topics  . Smoking status: Never Smoker   . Smokeless tobacco: Not on file  . Alcohol Use: No   OB History    No data available     Review of Systems  All other systems reviewed and are negative.     Allergies  Gabapentin; Lyrica; Ibuprofen; Latex; and Penicillins  Home Medications   Prior to Admission medications   Medication Sig Start Date End  Date Taking? Authorizing Provider  cyclobenzaprine (FLEXERIL) 10 MG tablet Take 20 mg by mouth 3 (three) times daily as needed for muscle spasms.   Yes Historical Provider, MD  DULoxetine (CYMBALTA) 60 MG capsule Take 60 mg by mouth daily.   Yes Historical Provider, MD  hydrochlorothiazide (HYDRODIURIL) 25 MG tablet Take 25 mg by mouth daily.  08/21/11  Yes Historical Provider, MD  hydrOXYzine (VISTARIL) 50 MG capsule Take 50 mg by mouth 3 (three) times daily as needed.   Yes Historical Provider, MD  lisinopril (PRINIVIL,ZESTRIL) 40 MG tablet Take 20 mg by mouth daily.   Yes Historical Provider, MD  omeprazole (PRILOSEC) 20 MG capsule Take 20 mg by mouth as needed (indigestion).    Yes Historical Provider, MD  potassium chloride SA (K-DUR,KLOR-CON) 20 MEQ tablet Take 1 tablet (20 mEq total) by mouth daily. 12/13/13  Yes Runell GessJonathan J Berry, MD  VITAMIN D, ERGOCALCIFEROL, PO Take 1 capsule by mouth daily.   Yes Historical Provider, MD  atorvastatin (LIPITOR) 20 MG tablet Take 1 tablet (20 mg total) by mouth daily. 12/10/13   Runell GessJonathan J Berry, MD   BP 160/98 mmHg  Pulse 90  Temp(Src) 97.8 F (36.6 C) (Oral)  Resp 18  SpO2 95% Physical Exam  Constitutional: She appears well-developed and well-nourished. No distress.  HENT:  Head: Normocephalic and atraumatic.  Right Ear: External ear normal.  Left Ear: External ear normal.  Eyes: Conjunctivae are normal. Right eye exhibits no discharge. Left eye exhibits no discharge. No scleral icterus.  Neck: Neck supple. No tracheal deviation present.  Cardiovascular: Normal rate, regular rhythm and intact distal pulses.   Pulmonary/Chest: Effort normal and breath sounds normal. No stridor. No respiratory distress. She has no wheezes. She has no rales.  Abdominal: Soft. Bowel sounds are normal. She exhibits no distension. There is no tenderness. There is no rebound and no guarding.  Musculoskeletal: She exhibits no edema or tenderness.  Neurological: She is  alert. She has normal strength. No cranial nerve deficit (no facial droop, extraocular movements intact, no slurred speech) or sensory deficit. She exhibits normal muscle tone. She displays no seizure activity. Coordination normal.  Equal grip strength bilaterally, normal sensation, normal plantar flexion bilaterally   Skin: Skin is warm and dry. No rash noted.  Hyperesthesia LLE  Psychiatric: She has a normal mood and affect.  Nursing note and vitals reviewed.   ED Course  Procedures (including critical care time) Labs Review Labs Reviewed  URINALYSIS, ROUTINE W REFLEX MICROSCOPIC - Abnormal; Notable for the following:    Leukocytes, UA SMALL (*)    All other components within normal limits  CBC WITH DIFFERENTIAL/PLATELET - Abnormal; Notable for the following:    RBC 3.30 (*)    Hemoglobin 10.6 (*)    HCT 32.5 (*)    All other components within normal limits  BASIC METABOLIC PANEL - Abnormal; Notable for the following:    Potassium 3.0 (*)    Glucose, Bld 122 (*)    GFR calc non Af Amer 73 (*)    GFR calc Af Amer 85 (*)    All other components within normal limits  URINE MICROSCOPIC-ADD ON  I-STAT TROPOININ, ED    Imaging Review No results found.   EKG Interpretation   Date/Time:  Friday May 20 2014 14:15:23 EST Ventricular Rate:  101 PR Interval:  168 QRS Duration: 76 QT Interval:  365 QTC Calculation: 473 R Axis:   45 Text Interpretation:  Sinus tachycardia Atrial premature complex Consider  left atrial enlargement Borderline T wave abnormalities Baseline wander in  lead(s) II III aVL aVF V2 V4 No old tracing to compare Confirmed by Suzane Vanderweide   MD-J, Tionna Gigante ([id number]) on 05/20/2014 6:22:02 PM      MDM   Final diagnoses:  Muscle pain, myofacial    Pt has had chronic neuropathic pain for years.  She has follow up with a neurologist.  Chest pain is atypical.  Doubt cardiac issue. At this time there does not appear to be any evidence of an acute emergency medical  condition and the patient appears stable for discharge with appropriate outpatient follow up.     Linwood Dibbles, MD 05/20/14 2108

## 2014-05-20 NOTE — ED Notes (Signed)
Pt reports L sided body pain, specifically leg and arm for months, but worse since Dec 1. C/o "heart feels like it is about to jump up my throat." Sts she is waiting for an appt 05/29/14 to be tested for MS but sts she could not take the pain anymore today and came here.

## 2014-11-08 ENCOUNTER — Telehealth: Payer: Self-pay | Admitting: Cardiovascular Disease

## 2014-11-08 NOTE — Telephone Encounter (Addendum)
Pt called in stating that she has been having some swelling in the lower part of both of her legs and feet along with some palpitations. She would like to be seen as soon as possible by Dr. Allyson SabalBerry.  Please call  Thanks

## 2014-11-08 NOTE — Telephone Encounter (Signed)
Spoke with patient regarding complaints. She reports she has had increasing LE edema over the weekend ans has increased her hctz to 2 tablets daily since Saturday night. She reports this has decreased her swelling some and she reports her weight is down. She did not provide weight readings. She reports she has had pain in her legs for years. She reports she has gotten cortisone shots in her back for radiculopathy with her last being May 2016. She reports she had test for Lyme disease - part was positive, part was negative. She is wondering is she has nerve damage.   Patient reports palpitations that are bothersome, but also reports she has had these for years.   She is due for yearly ROV with Dr. Allyson SabalBerry in August so appointment was made for 11/18/14 @ 1030 with Dr. Allyson SabalBerry.

## 2014-11-18 ENCOUNTER — Encounter: Payer: Self-pay | Admitting: Cardiovascular Disease

## 2014-11-18 ENCOUNTER — Ambulatory Visit (INDEPENDENT_AMBULATORY_CARE_PROVIDER_SITE_OTHER): Payer: BLUE CROSS/BLUE SHIELD

## 2014-11-18 ENCOUNTER — Ambulatory Visit (INDEPENDENT_AMBULATORY_CARE_PROVIDER_SITE_OTHER): Payer: BLUE CROSS/BLUE SHIELD | Admitting: Cardiovascular Disease

## 2014-11-18 VITALS — BP 128/82 | HR 87 | Ht 61.0 in | Wt 226.4 lb

## 2014-11-18 DIAGNOSIS — Z79899 Other long term (current) drug therapy: Secondary | ICD-10-CM

## 2014-11-18 DIAGNOSIS — R002 Palpitations: Secondary | ICD-10-CM | POA: Diagnosis not present

## 2014-11-18 DIAGNOSIS — I10 Essential (primary) hypertension: Secondary | ICD-10-CM | POA: Diagnosis not present

## 2014-11-18 DIAGNOSIS — E785 Hyperlipidemia, unspecified: Secondary | ICD-10-CM | POA: Diagnosis not present

## 2014-11-18 MED ORDER — HYDROCHLOROTHIAZIDE 50 MG PO TABS: 50.0000 mg | ORAL_TABLET | Freq: Every day | ORAL | Status: DC

## 2014-11-18 NOTE — Assessment & Plan Note (Signed)
Mrs. Bonnie Fox complaints of daily palpitations. I'm going to obtain a 1 week event monitor

## 2014-11-18 NOTE — Assessment & Plan Note (Signed)
History of obstructive sleep apnea currently not wearing her C Pap

## 2014-11-18 NOTE — Assessment & Plan Note (Addendum)
History of hyperlipidemia currently not on a statin drug. Her last lipid profile performed aortic revealed total cholesterol 259, LDL 176 and HDL 55. She does report being statin intolerant. I am referring her to [facility] for discussion about PCSK9 monoclonal injectables

## 2014-11-18 NOTE — Assessment & Plan Note (Signed)
History of hypertension blood pressure measured at 128/82. She is on lisinopril and hydrochlorothiazide. 15 current meds at current dosing

## 2014-11-18 NOTE — Progress Notes (Signed)
11/18/2014 Bonnie Fox   06-07-1965  [id number]  Primary Physician No primary care provider on file. Primary Cardiologist: Runell Gess MD [name]    HPI: Bonnie Fox is a 49 year old severely overweight African American female mother of one child works at [employer] . I last saw her in the office 12/10/13. Her primary care is provided by the [facility]. She is self referred for cardiovascular evaluation because of low potassium, high cholesterol and an abnormal EKG that she was told she had. Her cardiovascular risk factor profile is notable for 2 hypertension and hyper lipidemia. There is no family history. She does not smoke. She's never had an attack or stroke and denies chest pain or shortness of breath. She does have obstructive sleep apnea on CPAP which she says she has not worn in a while.Marland Kitchen Recent lipid profile performed at the [facility] 12/02/13 2 cholesterol 235 and LDL 154 HDL 56. She tells me that she is statin intolerant. She also describes palpitations and a daily basis.   Current Outpatient Prescriptions  Medication Sig Dispense Refill  . Cholecalciferol (VITAMIN D3) 50000 UNITS CAPS Take 1 capsule by mouth once a week.  6  . clonazePAM (KLONOPIN) 0.5 MG tablet Take 1 tablet by mouth at bedtime.  2  . DULoxetine (CYMBALTA) 60 MG capsule Take 60 mg by mouth 2 (two) times daily.     . hydrochlorothiazide (HYDRODIURIL) 25 MG tablet Take 50 mg by mouth daily.     Marland Kitchen lisinopril (PRINIVIL,ZESTRIL) 40 MG tablet Take 20 mg by mouth daily.    Marland Kitchen omeprazole (PRILOSEC) 20 MG capsule Take 20 mg by mouth as needed (indigestion).     Marland Kitchen oxyCODONE-acetaminophen (PERCOCET) 10-325 MG per tablet Take 1 tablet by mouth 2 (two) times daily.  0   No current facility-administered medications for this visit.    Allergies  Allergen Reactions  . Gabapentin Other (See Comments)    Makes pt feel "awful"  . Lyrica [Pregabalin] Other (See Comments)      Makes pt feel "awful" makes pain worse  . Ibuprofen Swelling and Rash  . Latex Swelling and Rash  . Penicillins Swelling and Rash    History   Social History  . Marital Status: Legally Separated    Spouse Name: N/A  . Number of Children: 1  . 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 Smoking status: Never Smoker   . Smokeless tobacco: Not on file  . Alcohol Use: No  . Drug Use: No  . Sexual Activity: Not on file   Other Topics Concern  . Not on file   Social History Narrative     Review of Systems: General: negative for chills, fever, night sweats or weight changes.  Cardiovascular: negative for chest pain, dyspnea on exertion, edema, orthopnea, palpitations, paroxysmal nocturnal dyspnea or shortness of breath Dermatological: negative for rash Respiratory: negative for cough or wheezing Urologic: negative for hematuria Abdominal: negative for nausea, vomiting, diarrhea, bright red blood per rectum, melena, or hematemesis Neurologic: negative for visual changes, syncope, or dizziness All other systems reviewed and are otherwise negative except as noted above.    Blood pressure 128/82, pulse 87, height  (1.549 m), weight 226 lb 6.4 oz (102.694 kg).  General appearance: alert and no distress Neck: no adenopathy, no carotid bruit, no JVD, supple, symmetrical, trachea midline and thyroid not enlarged,  symmetric, no tenderness/mass/nodules Lungs: clear to auscultation bilaterally Heart: regular rate and rhythm, S1, S2 normal, no murmur, click, rub or gallop Extremities: extremities normal, atraumatic, no cyanosis or edema  EKG normal sinus rhythm at 87 without ST or T-wave changes. I personally reviewed this EKG  ASSESSMENT AND PLAN:   Palpitations Bonnie Fox complaints of daily palpitations. I'm going to obtain a 1 week event monitor  OSA (obstructive sleep apnea) History of obstructive  sleep apnea currently not wearing her C Pap  Essential hypertension History of hypertension blood pressure measured at 128/82. She is on lisinopril and hydrochlorothiazide. 15 current meds at current dosing  Hyperlipidemia History of hyperlipidemia currently not on a statin drug. Her last lipid profile performed aortic revealed total cholesterol 259, LDL 176 and HDL 55. She does report being statin intolerant. I am referring her to [facility] for discussion about PCSK9 monoclonal injectables      Runell Gess MD [facility], [facility] 11/18/2014 11:28 AM

## 2014-11-18 NOTE — Patient Instructions (Signed)
Medication Instructions:   Increase HCTZ (hydrochlorothiazide) to  daily  Labwork:  A FASTING lipid profile: to be done next week.  There is a [employer] operational officer lab on the first floor of this building, [address].  They are open from 8am-5pm with a lunch from 12-2.  You do not need an appointment.    Testing/Procedures:   Event monitor. Event monitors are medical devices that record the heart's electrical activity. Doctors most often [name] these monitors to diagnose arrhythmias. Arrhythmias are problems with the speed or rhythm of the heartbeat. The monitor is a small, portable device. You can wear one while you do your normal daily activities. This is usually used to diagnose what is causing palpitations/syncope (passing out).   Follow-Up:  1 year with Dr Allyson Sabal  Any Other Special Instructions Will Be Listed Below (If Applicable). Dr Allyson Sabal has referred you to [facility], our pharmacist, for a lipid clinic evaluation.  She will review your blood work results.   Your Doctor has ordered you to wear a heart monitor. You will wear this for 7 days.   TIPS -  REMINDERS 1. The sensor is the lanyard that is worn around your neck every day - this is powered by a battery that needs to be changed every day 2. The monitor is the device that allows you to record symptoms - this will need to be charged daily 3. The sensor & monitor need to be within 100 feet of each other at all times 4. The sensor connects to the electrodes (stickers) - these should be changed every 24-48 hours (you do not have to remove them when you bathe, just make sure they are dry when you connect it back to the sensor 5. If you need more supplies (electrodes, batteries), please call the 1-800 # on the back of the pamphlet and [employer] will mail you more supplies 6. If your skin becomes sensitive, please try the sample pack of sensitive skin electrodes (the white packet in your silver box) and call [employer] to have them mail you more of  these type of electrodes 7. When you are finish wearing the monitor, please place all supplies back in the silver box, place the silver box in the pre-packaged UPS bag and drop off at [employer] or call them so they can come pick it up   Cardiac Event Monitoring A cardiac event monitor is a small recording device used to help detect abnormal heart rhythms (arrhythmias). The monitor is used to record heart rhythm when noticeable symptoms such as the following occur:  Fast heartbeats (palpitations), such as heart racing or fluttering.  Dizziness.  Fainting or light-headedness.  Unexplained weakness. The monitor is wired to two electrodes placed on your chest. Electrodes are flat, sticky disks that attach to your skin. The monitor can be worn for up to 30 days. You will wear the monitor at all times, except when bathing.  HOW TO USE YOUR CARDIAC EVENT MONITOR A technician will prepare your chest for the electrode placement. The technician will show you how to place the electrodes, how to work the monitor, and how to replace the batteries. Take time to practice using the monitor before you leave the office. Make sure you understand how to send the information from the monitor to your health care provider. This requires a telephone with a landline, not a cell phone. You need to:  Wear your monitor at all times, except when you are in water:  Do not get the monitor  wet.  Take the monitor off when bathing. Do not swim or use a hot tub with it on.  Keep your skin clean. Do not put body lotion or moisturizer on your chest.  Change the electrodes daily or any time they stop sticking to your skin. You might need to use tape to keep them on.  It is possible that your skin under the electrodes could become irritated. To keep this from happening, try to put the electrodes in slightly different places on your chest. However, they must remain in the area under your left breast and in the upper right section of  your chest.  Make sure the monitor is safely clipped to your clothing or in a location close to your body that your health care provider recommends.  Press the button to record when you feel symptoms of heart trouble, such as dizziness, weakness, light-headedness, palpitations, thumping, shortness of breath, unexplained weakness, or a fluttering or racing heart. The monitor is always on and records what happened slightly before you pressed the button, so do not worry about being too late to get good information.  Keep a diary of your activities, such as walking, doing chores, and taking medicine. It is especially important to note what you were doing when you pushed the button to record your symptoms. This will help your health care provider determine what might be contributing to your symptoms. The information stored in your monitor will be reviewed by your health care provider alongside your diary entries.  Send the recorded information as recommended by your health care provider. It is important to understand that it will take some time for your health care provider to process the results.  Change the batteries as recommended by your health care provider. SEEK IMMEDIATE MEDICAL CARE IF:   You have chest pain.  You have extreme difficulty breathing or shortness of breath.  You develop a very fast heartbeat that persists.  You develop dizziness that does not go away.  You faint or constantly feel you are about to faint. Document Released: 01/16/2008 Document Revised: 08/23/2013 Document Reviewed: 10/05/2012 [facility] Patient Information 2015 [place], [place]. This information is not intended to replace advice given to you by your health care provider. Make sure you discuss any questions you have with your health care provider.

## 2014-12-01 ENCOUNTER — Ambulatory Visit: Payer: BLUE CROSS/BLUE SHIELD | Admitting: Pharmacist Clinician (PhC)/ Clinical Pharmacy Specialist

## 2014-12-02 ENCOUNTER — Encounter: Payer: Self-pay | Admitting: *Deleted

## 2015-01-23 ENCOUNTER — Other Ambulatory Visit: Payer: Self-pay | Admitting: *Deleted

## 2015-01-23 MED ORDER — HYDROCHLOROTHIAZIDE 50 MG PO TABS: 50.0000 mg | ORAL_TABLET | Freq: Every day | ORAL | Status: DC

## 2015-05-29 ENCOUNTER — Other Ambulatory Visit: Payer: Self-pay | Admitting: *Deleted

## 2015-05-31 ENCOUNTER — Other Ambulatory Visit: Payer: Self-pay | Admitting: *Deleted

## 2015-05-31 MED ORDER — HYDROCHLOROTHIAZIDE 50 MG PO TABS: 50.0000 mg | ORAL_TABLET | Freq: Every day | ORAL | Status: DC

## 2016-02-29 ENCOUNTER — Other Ambulatory Visit: Payer: Self-pay | Admitting: Cardiovascular Disease

## 2016-06-02 ENCOUNTER — Other Ambulatory Visit: Payer: Self-pay | Admitting: Cardiovascular Disease

## 2016-06-28 ENCOUNTER — Other Ambulatory Visit: Payer: Self-pay | Admitting: Occupational Medicine

## 2016-06-28 ENCOUNTER — Ambulatory Visit: Payer: Self-pay

## 2016-06-28 DIAGNOSIS — S300XXA Contusion of lower back and pelvis, initial encounter: Secondary | ICD-10-CM

## 2016-07-12 ENCOUNTER — Other Ambulatory Visit: Payer: Self-pay | Admitting: Cardiovascular Disease

## 2016-07-12 NOTE — Telephone Encounter (Signed)
REFILL 

## 2016-08-16 ENCOUNTER — Other Ambulatory Visit: Payer: Self-pay | Admitting: Cardiovascular Disease

## 2016-08-19 NOTE — Telephone Encounter (Signed)
REFILL 

## 2018-03-01 IMAGING — CR DG SACRUM/COCCYX 2+V
3 series · 3 of 3 positions shown · non-contrast
Comparison: CT abdomen 01/08/2006

CLINICAL DATA: Status post fall.  Low back pain.

EXAM:
SACRUM AND COCCYX - 2+ VIEW

[view not recorded (1 of 3)]
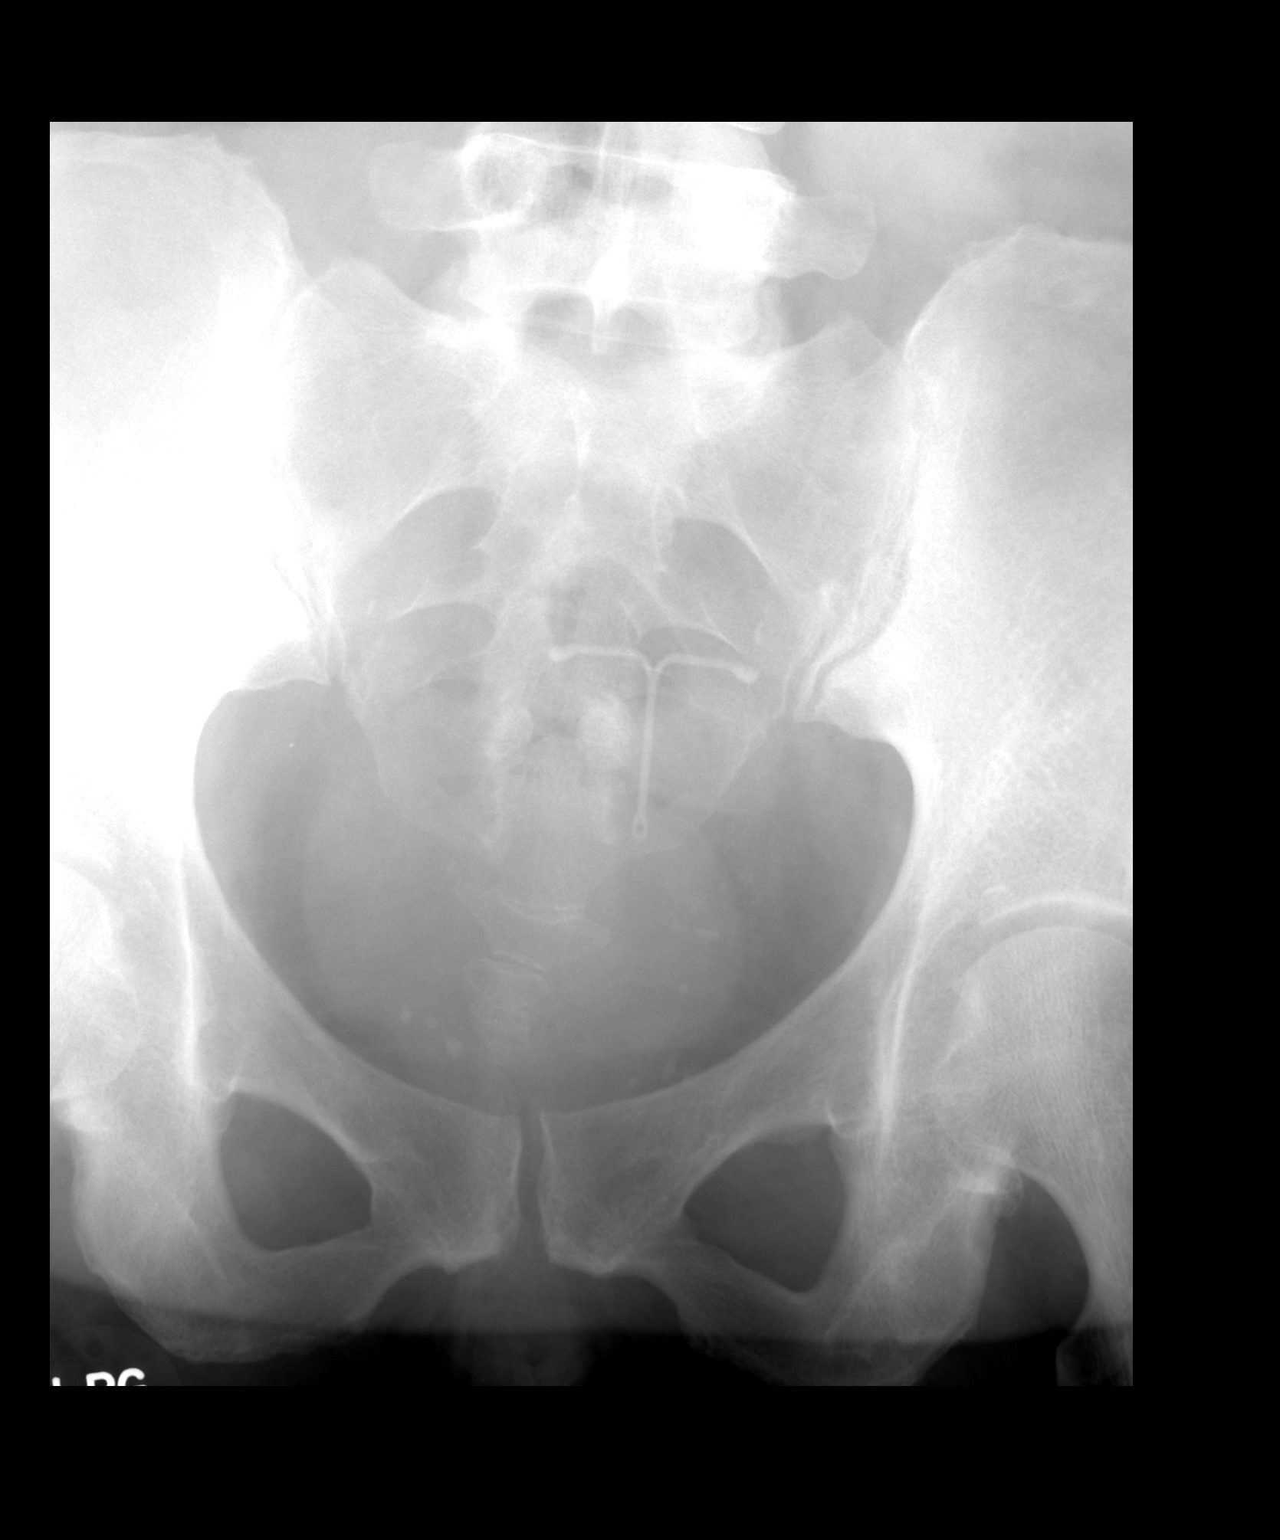

[view not recorded (2 of 3)]
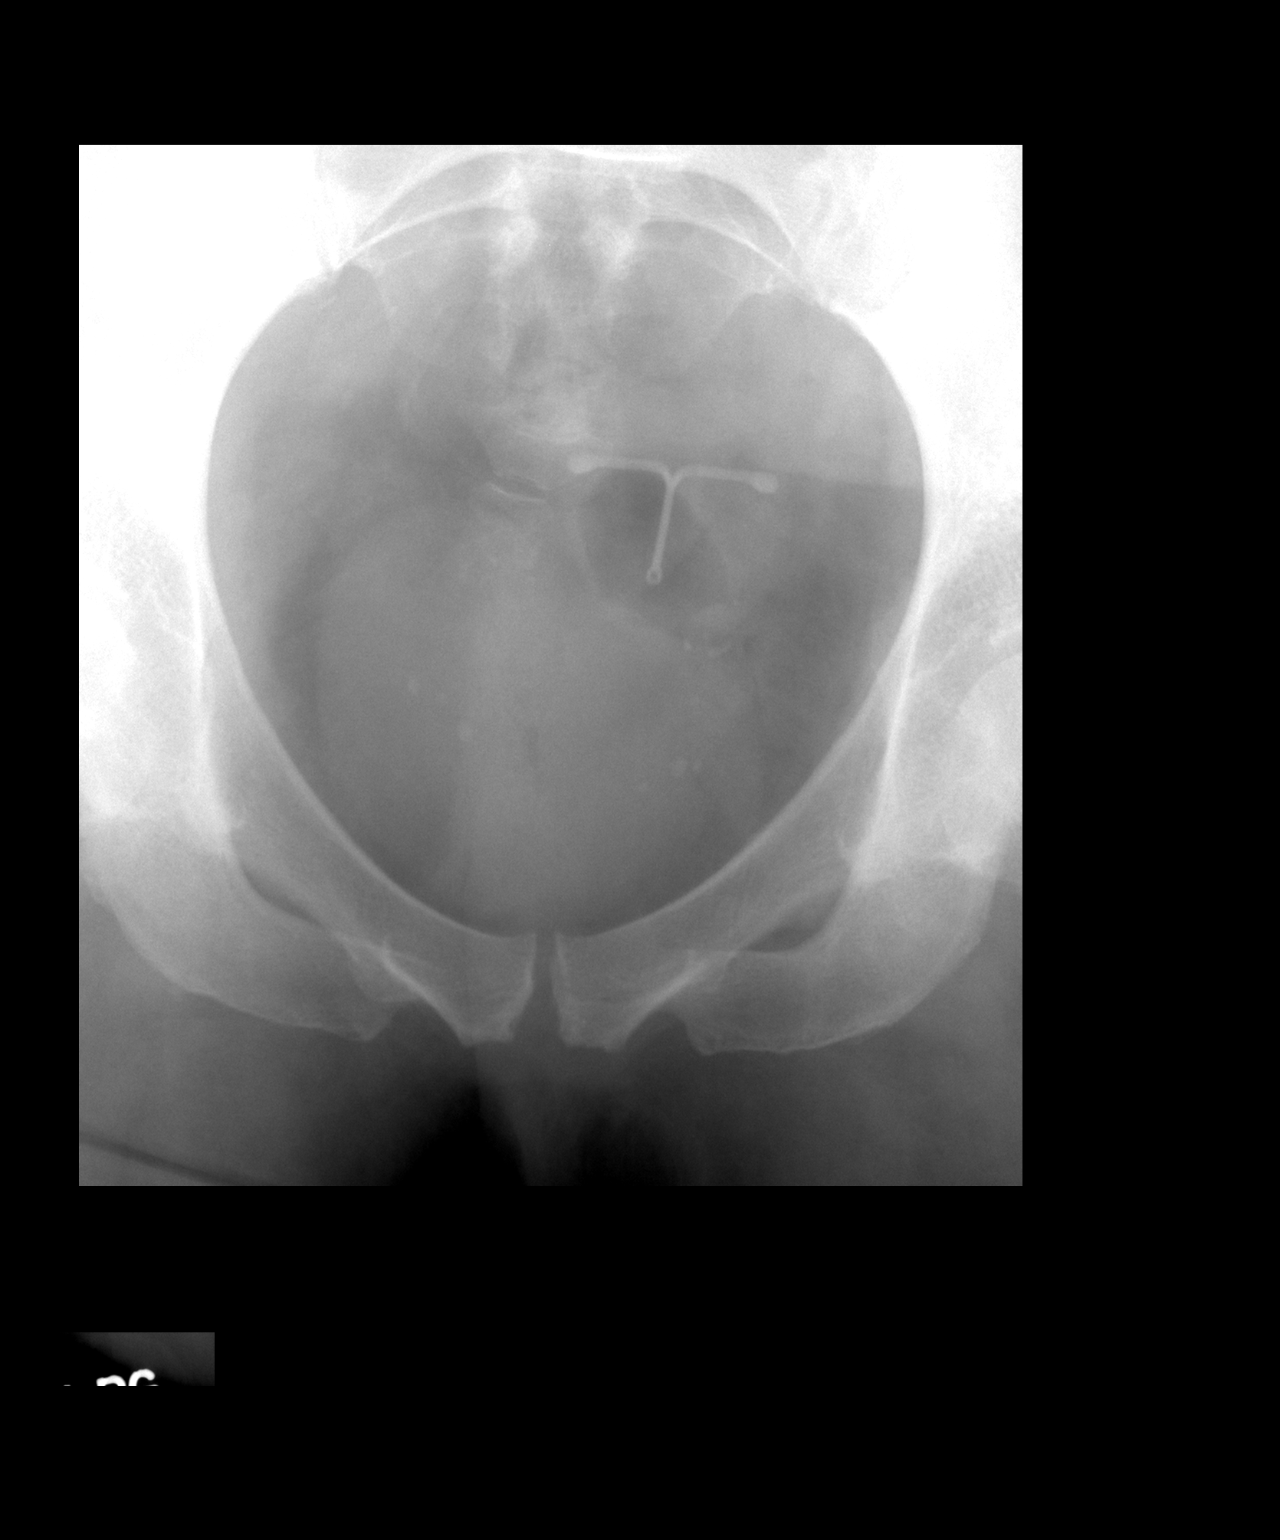

[view not recorded (3 of 3)]
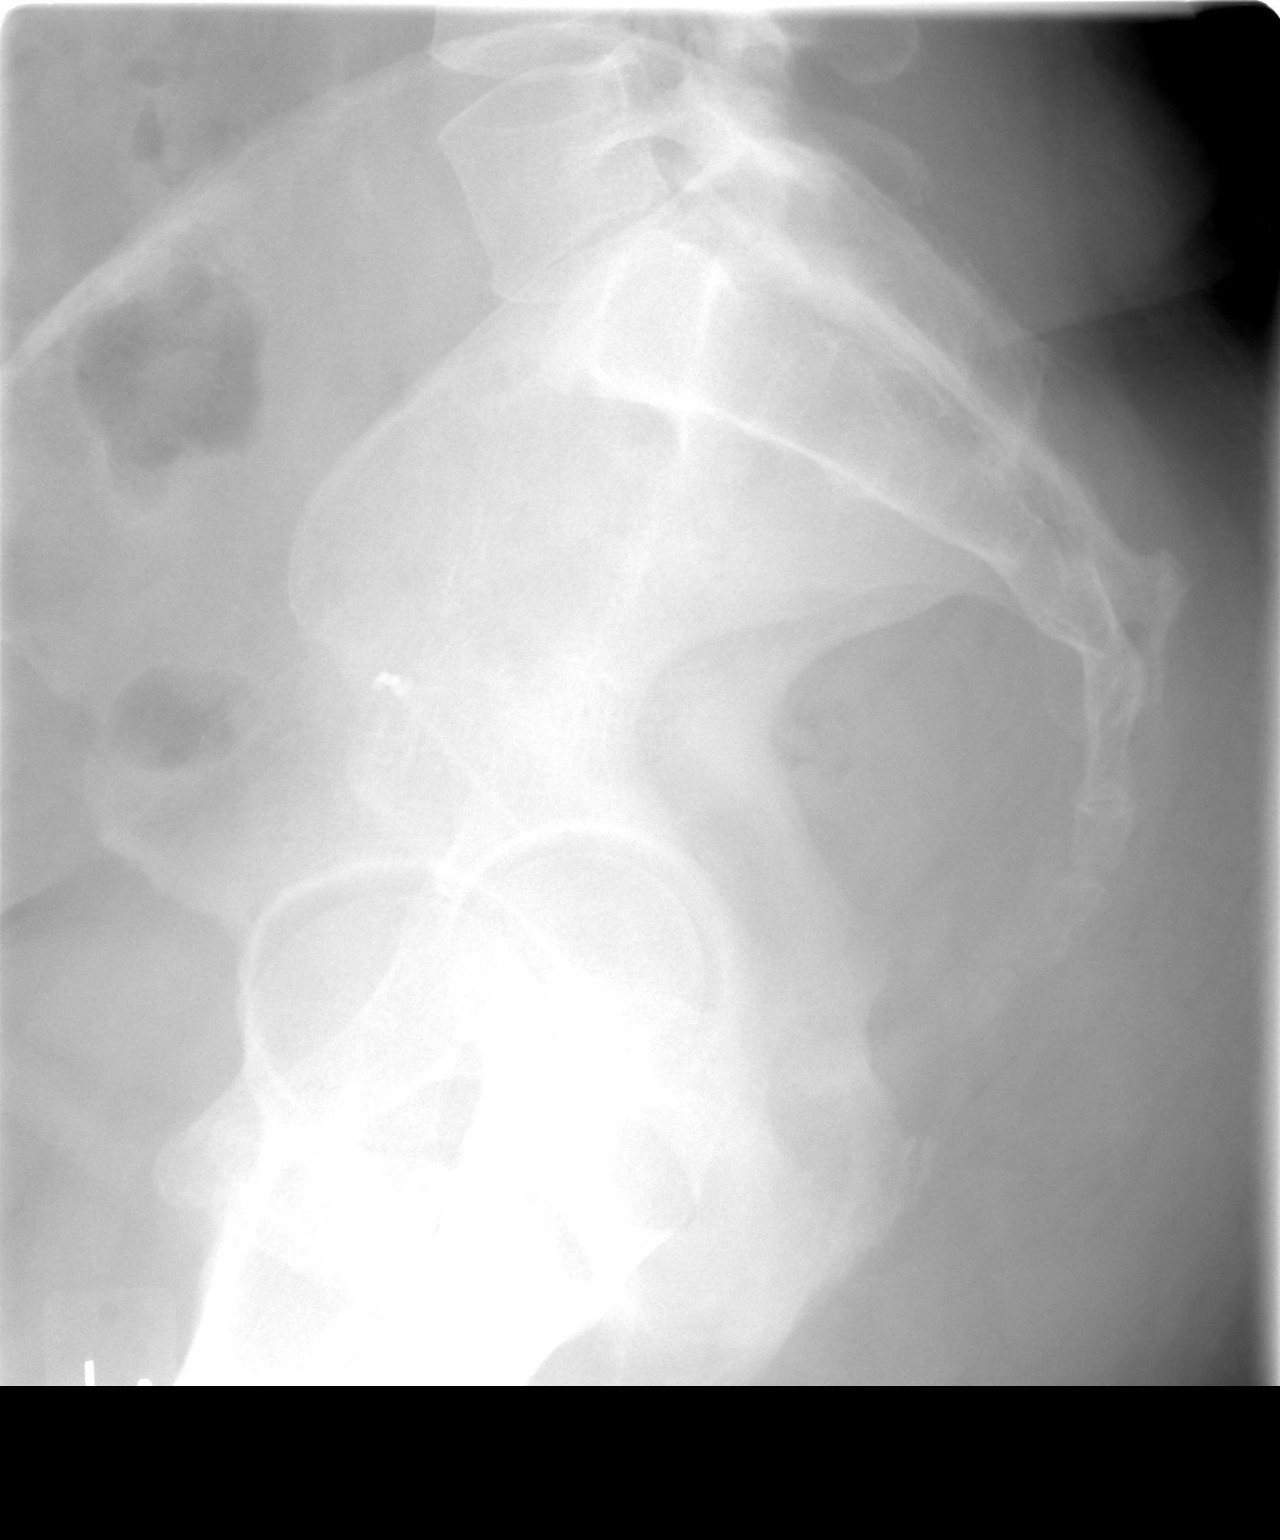

[3 of 3 positions shown; findings below may reference images not displayed]

FINDINGS: There is no evidence of fracture or other focal bone lesions. No
aggressive lytic or sclerotic osseous lesion. No SI joint widening
or erosions.

Mild degenerative disc disease with disc height loss at L5-S1.

T-type IUD noted in the pelvis.
IMPRESSION: No acute osseous injury of the sacrum and coccyx.

## 2020-06-15 DIAGNOSIS — M542 Cervicalgia: Secondary | ICD-10-CM | POA: Diagnosis not present

## 2020-06-15 DIAGNOSIS — R201 Hypoesthesia of skin: Secondary | ICD-10-CM | POA: Diagnosis not present

## 2020-06-15 DIAGNOSIS — M545 Low back pain, unspecified: Secondary | ICD-10-CM | POA: Diagnosis not present

## 2020-06-15 DIAGNOSIS — R2689 Other abnormalities of gait and mobility: Secondary | ICD-10-CM | POA: Diagnosis not present

## 2020-06-15 DIAGNOSIS — G603 Idiopathic progressive neuropathy: Secondary | ICD-10-CM | POA: Diagnosis not present

## 2020-08-10 DIAGNOSIS — R201 Hypoesthesia of skin: Secondary | ICD-10-CM | POA: Diagnosis not present

## 2020-08-10 DIAGNOSIS — G5603 Carpal tunnel syndrome, bilateral upper limbs: Secondary | ICD-10-CM | POA: Diagnosis not present

## 2020-08-10 DIAGNOSIS — M545 Low back pain, unspecified: Secondary | ICD-10-CM | POA: Diagnosis not present

## 2020-08-10 DIAGNOSIS — Z79899 Other long term (current) drug therapy: Secondary | ICD-10-CM | POA: Diagnosis not present

## 2020-08-10 DIAGNOSIS — M542 Cervicalgia: Secondary | ICD-10-CM | POA: Diagnosis not present

## 2020-11-02 DIAGNOSIS — G5603 Carpal tunnel syndrome, bilateral upper limbs: Secondary | ICD-10-CM | POA: Diagnosis not present

## 2020-11-02 DIAGNOSIS — M545 Low back pain, unspecified: Secondary | ICD-10-CM | POA: Diagnosis not present

## 2020-11-02 DIAGNOSIS — M797 Fibromyalgia: Secondary | ICD-10-CM | POA: Diagnosis not present

## 2020-11-02 DIAGNOSIS — M542 Cervicalgia: Secondary | ICD-10-CM | POA: Diagnosis not present

## 2020-11-02 DIAGNOSIS — R202 Paresthesia of skin: Secondary | ICD-10-CM | POA: Diagnosis not present

## 2021-01-25 DIAGNOSIS — M545 Low back pain, unspecified: Secondary | ICD-10-CM | POA: Diagnosis not present

## 2021-01-25 DIAGNOSIS — M5412 Radiculopathy, cervical region: Secondary | ICD-10-CM | POA: Diagnosis not present

## 2021-01-25 DIAGNOSIS — M797 Fibromyalgia: Secondary | ICD-10-CM | POA: Diagnosis not present

## 2021-01-25 DIAGNOSIS — G5603 Carpal tunnel syndrome, bilateral upper limbs: Secondary | ICD-10-CM | POA: Diagnosis not present

## 2021-01-25 DIAGNOSIS — Z79899 Other long term (current) drug therapy: Secondary | ICD-10-CM | POA: Diagnosis not present

## 2021-03-22 DIAGNOSIS — Z79899 Other long term (current) drug therapy: Secondary | ICD-10-CM | POA: Diagnosis not present

## 2021-03-22 DIAGNOSIS — M79602 Pain in left arm: Secondary | ICD-10-CM | POA: Diagnosis not present

## 2021-03-22 DIAGNOSIS — M545 Low back pain, unspecified: Secondary | ICD-10-CM | POA: Diagnosis not present

## 2021-07-12 DIAGNOSIS — M5417 Radiculopathy, lumbosacral region: Secondary | ICD-10-CM | POA: Diagnosis not present

## 2021-07-12 DIAGNOSIS — M797 Fibromyalgia: Secondary | ICD-10-CM | POA: Diagnosis not present

## 2021-07-12 DIAGNOSIS — Z79899 Other long term (current) drug therapy: Secondary | ICD-10-CM | POA: Diagnosis not present

## 2021-07-12 DIAGNOSIS — G603 Idiopathic progressive neuropathy: Secondary | ICD-10-CM | POA: Diagnosis not present

## 2021-07-12 DIAGNOSIS — M5412 Radiculopathy, cervical region: Secondary | ICD-10-CM | POA: Diagnosis not present

## 2021-09-20 DIAGNOSIS — Z79899 Other long term (current) drug therapy: Secondary | ICD-10-CM | POA: Diagnosis not present

## 2021-09-20 DIAGNOSIS — R634 Abnormal weight loss: Secondary | ICD-10-CM | POA: Diagnosis not present

## 2021-09-20 DIAGNOSIS — M797 Fibromyalgia: Secondary | ICD-10-CM | POA: Diagnosis not present

## 2021-09-20 DIAGNOSIS — G603 Idiopathic progressive neuropathy: Secondary | ICD-10-CM | POA: Diagnosis not present

## 2021-09-20 DIAGNOSIS — M545 Low back pain, unspecified: Secondary | ICD-10-CM | POA: Diagnosis not present

## 2021-09-20 DIAGNOSIS — R202 Paresthesia of skin: Secondary | ICD-10-CM | POA: Diagnosis not present

## 2021-09-20 DIAGNOSIS — R269 Unspecified abnormalities of gait and mobility: Secondary | ICD-10-CM | POA: Diagnosis not present

## 2021-11-29 DIAGNOSIS — M542 Cervicalgia: Secondary | ICD-10-CM | POA: Diagnosis not present

## 2021-11-29 DIAGNOSIS — R269 Unspecified abnormalities of gait and mobility: Secondary | ICD-10-CM | POA: Diagnosis not present

## 2021-11-29 DIAGNOSIS — Z79899 Other long term (current) drug therapy: Secondary | ICD-10-CM | POA: Diagnosis not present

## 2021-11-29 DIAGNOSIS — M797 Fibromyalgia: Secondary | ICD-10-CM | POA: Diagnosis not present

## 2021-11-29 DIAGNOSIS — M545 Low back pain, unspecified: Secondary | ICD-10-CM | POA: Diagnosis not present

## 2021-12-12 DIAGNOSIS — Z7985 Long-term (current) use of injectable non-insulin antidiabetic drugs: Secondary | ICD-10-CM | POA: Diagnosis not present

## 2021-12-12 DIAGNOSIS — Z9884 Bariatric surgery status: Secondary | ICD-10-CM | POA: Diagnosis not present

## 2021-12-12 DIAGNOSIS — Z713 Dietary counseling and surveillance: Secondary | ICD-10-CM | POA: Diagnosis not present

## 2021-12-12 DIAGNOSIS — K912 Postsurgical malabsorption, not elsewhere classified: Secondary | ICD-10-CM | POA: Diagnosis not present

## 2021-12-12 DIAGNOSIS — E119 Type 2 diabetes mellitus without complications: Secondary | ICD-10-CM | POA: Diagnosis not present

## 2022-02-21 DIAGNOSIS — M797 Fibromyalgia: Secondary | ICD-10-CM | POA: Diagnosis not present

## 2022-02-21 DIAGNOSIS — G603 Idiopathic progressive neuropathy: Secondary | ICD-10-CM | POA: Diagnosis not present

## 2022-02-21 DIAGNOSIS — M545 Low back pain, unspecified: Secondary | ICD-10-CM | POA: Diagnosis not present

## 2022-02-21 DIAGNOSIS — M542 Cervicalgia: Secondary | ICD-10-CM | POA: Diagnosis not present

## 2022-02-21 DIAGNOSIS — Z79899 Other long term (current) drug therapy: Secondary | ICD-10-CM | POA: Diagnosis not present

## 2022-04-03 DIAGNOSIS — Z48815 Encounter for surgical aftercare following surgery on the digestive system: Secondary | ICD-10-CM | POA: Diagnosis not present

## 2022-04-03 DIAGNOSIS — Z9884 Bariatric surgery status: Secondary | ICD-10-CM | POA: Diagnosis not present

## 2022-04-03 DIAGNOSIS — K912 Postsurgical malabsorption, not elsewhere classified: Secondary | ICD-10-CM | POA: Diagnosis not present

## 2022-04-03 DIAGNOSIS — E119 Type 2 diabetes mellitus without complications: Secondary | ICD-10-CM | POA: Diagnosis not present

## 2022-04-03 DIAGNOSIS — Z713 Dietary counseling and surveillance: Secondary | ICD-10-CM | POA: Diagnosis not present

## 2022-04-25 DIAGNOSIS — G603 Idiopathic progressive neuropathy: Secondary | ICD-10-CM | POA: Diagnosis not present

## 2022-04-25 DIAGNOSIS — M5459 Other low back pain: Secondary | ICD-10-CM | POA: Diagnosis not present

## 2022-06-21 NOTE — Progress Notes (Addendum)
Office Visit Note  Patient: Bonnie Fox             Date of Birth: 1965-12-29           MRN: [id number]             PCP: [facility] [place] Referring: Boyd Kerbs, MD Visit Date: 07/05/2022 Occupation: @GUAROCC @  Subjective:   Pain in joints  History of Present Illness: Bonnie Fox is a 57 y.o. female seen in consultation per request of her neurologist.  According the patient she served in the [employer] from [birth year]-[age].  She also served in the [employer].  She states when she came back from the [place] War she was experiencing joint pain and Nerve pain.  At the time she was diagnosed with fibromyalgia syndrome at the [facility].  She states that she has been under care of Dr. Martie Round, her neurologist for many years.  She has been diagnosed with neuropathy, fibromyalgia and bilateral carpal tunnel syndrome.  She states in the last year she has had few episodes of falling when she will blackout and her legs will give out.  She had extensive workup by her neurologist.  She was started on B12 supplements.  She also had labs last year which showed positive anti-CCP for that reason she was referred to me.  She denies any history of joint swelling.  She has discomfort in her left trapezius region, lower back, wrists, hips, knees, feet.  She states her feet are still numb most the time.  There is no family history of autoimmune disease.  He had duodenal switch in September 2021 and had intentional weight loss since then.  She states that she lost 88 pounds since then.  She is gravida 2, para 1, miscarriage 1.  There is no history of DVTs.    Activities of Daily Living:  Patient reports morning stiffness for 1-1.5 hour.   Patient Reports nocturnal pain.  Difficulty dressing/grooming: Denies Difficulty climbing stairs: Reports Difficulty getting out of chair: Reports Difficulty using hands for taps, buttons, cutlery, and/or writing: Denies  Review of Systems  Constitutional:   Positive for fatigue.  HENT:  Positive for mouth dryness. Negative for mouth sores.   Eyes:  Negative for dryness.  Respiratory:  Negative for shortness of breath.   Cardiovascular:  Negative for chest pain and palpitations.  Gastrointestinal:  Negative for blood in stool, constipation and diarrhea.  Endocrine: Negative for increased urination.  Genitourinary:  Negative for involuntary urination.  Musculoskeletal:  Positive for joint pain, gait problem, joint pain and morning stiffness. Negative for joint swelling, myalgias, muscle weakness, muscle tenderness and myalgias.  Skin:  Negative for color change, hair loss and sensitivity to sunlight.  Allergic/Immunologic: Negative for susceptible to infections.  Neurological:  Positive for dizziness. Negative for headaches.  Hematological:  Negative for swollen glands.  Psychiatric/Behavioral:  Positive for depressed mood. Negative for sleep disturbance. The patient is nervous/anxious.     PMFS History:  Patient Active Problem List   Diagnosis Date Noted   Palpitations 11/18/2014   Hypokalemia 12/10/2013   Hyperlipidemia 12/10/2013   OSA (obstructive sleep apnea) 07/12/2011   CROHN'S DISEASE 01/23/2009   OBESITY 01/02/2009   Essential hypertension 01/02/2009   FOOT, PAIN 01/02/2009    Past Medical History:  Diagnosis Date   Anxiety    Depression    Diabetes (HCC)    Fibromyalgia    Hyperlipidemia    Hypertension    Hypokalemia  OSA (obstructive sleep apnea) 07/12/2011   PSG 07/22/11>>AHI 8.9, RDI 21.8, SpO2 low 90%.  Positional and REM effect.    Palpitations    PTSD (post-traumatic stress disorder)     Family History  Problem Relation Age of Onset   Diabetes Mother    Hypertension Mother    Diabetes Father    Prostate cancer Father    Kidney disease Father    Hypertension Sister    Hypertension Brother    Stroke Brother    Diabetes Brother    Past Surgical History:  Procedure Laterality Date   BREAST REDUCTION  SURGERY     CESAREAN SECTION     CHOLECYSTECTOMY     LAPAROSCOPIC GASTRIC RESTRICTIVE DUODENAL PROCEDURE (DUODENAL SWITCH)     Social History   Social History Narrative   Not on file   Immunization History  Administered Date(s) Administered   COVID-19, mRNA, vaccine(Comirnaty)12 years and older 02/11/2022   Pfizer Covid-19 Vaccine Bivalent Booster 61yrs & up 03/08/2021     Objective: Vital Signs: BP 133/84 (BP Location: Right Arm, Patient Position: Sitting, Cuff Size: Normal)   Pulse 92   Resp 12   Ht 5' (1.524 m)   Wt 142 lb (64.4 kg)   BMI 27.73 kg/m    Physical Exam Vitals and nursing note reviewed.  Constitutional:      Appearance: She is well-developed.  HENT:     Head: Normocephalic and atraumatic.  Eyes:     Conjunctiva/sclera: Conjunctivae normal.  Cardiovascular:     Rate and Rhythm: Normal rate and regular rhythm.     Heart sounds: Normal heart sounds.  Pulmonary:     Effort: Pulmonary effort is normal.     Breath sounds: Normal breath sounds.  Abdominal:     General: Bowel sounds are normal.     Palpations: Abdomen is soft.  Musculoskeletal:     Cervical back: Normal range of motion.  Lymphadenopathy:     Cervical: No cervical adenopathy.  Skin:    General: Skin is warm and dry.     Capillary Refill: Capillary refill takes less than 2 seconds.     Comments: Dry skin on feet with nail dystrophy was noted.  Neurological:     Mental Status: She is alert and oriented to person, place, and time.  Psychiatric:        Behavior: Behavior normal.      Musculoskeletal Exam: Cervical, thoracic and lumbar spine were in good range of motion.  She had left trapezius spasm.  Shoulder joints, elbow joints, wrist joints were in good range of motion with no synovitis.  No synovitis was noted over MCP joints.  Mild and DIP thickening was noted.  Hip joints were in good range of motion with discomfort.  Knee joints were in good range of motion without any warmth  swelling or effusion.  She had tenderness over ankles but no synovitis was noted.  There was tenderness over MTPs and PIPs but no synovitis was noted.  Nail dystrophy was noted.  CDAI Exam: CDAI Score: -- Patient Global: --; Provider Global: -- Swollen: --; Tender: -- Joint Exam 07/05/2022   No joint exam has been documented for this visit   There is currently no information documented on the homunculus. Go to the Rheumatology activity and complete the homunculus joint exam.  Investigation: No additional findings.  Imaging: No results found.  Recent Labs: Lab Results  Component Value Date   WBC 8.6 05/20/2014   HGB 10.6 (L)  05/20/2014   PLT 345 05/20/2014   NA 136 05/20/2014   K 3.0 (L) 05/20/2014   CL 99 05/20/2014   CO2 29 05/20/2014   GLUCOSE 122 (H) 05/20/2014   BUN 16 05/20/2014   CREATININE 0.90 05/20/2014   BILITOT 0.4 12/10/2013   ALKPHOS 68 12/10/2013   AST 17 12/10/2013   ALT 17 12/10/2013   PROT 7.9 12/10/2013   ALBUMIN 4.7 12/10/2013   CALCIUM 9.1 05/20/2014   GFRAA 85 (L) 05/20/2014   September 20, 2021 ANA negative, RF<14, anti-CCP 75, 14 3 3  eta<0.2, SPEP normal, B6 normal, B1 normal, immunoglobulins normal, folate normal, B12 normal  Speciality Comments: No specialty comments available.  Procedures:  No procedures performed Allergies: Gabapentin, Lyrica [pregabalin], Ibuprofen, Latex, and Penicillins   Assessment / Plan:     Visit Diagnoses: Pain in both hands -patient complains of pain and discomfort in her bilateral hands and wrist joints.  She denies any history of joint swelling.  No synovitis was noted.  Plan: XR Hand 2 View Right, XR Hand 2 View Left.  X-rays of bilateral hands were suggestive of osteoarthritis.  Chronic pain of both hips -she had good range of motion of her hip joints.  She had discomfort range of motion of her bilateral hip joints.  Plan: XR HIPS BILAT W OR W/O PELVIS 3-4 VIEWS.  X-rays of bilateral hip joints and SI joints were  unremarkable.  Pain in both feet -she complains of discomfort in her bilateral feet.  She states she has neuropathy which causes constant pain.  She also has discomfort in her ankles.  She denies any history of joint swelling.  No synovitis was noted.  Plan: XR Foot 2 Views Right, XR Foot 2 Views Left x-rays of bilateral feet were suggestive of osteoarthritis.  Bilateral carpal tunnel syndrome - Uses carpal tunnel syndrome braces.  She is followed by her neurologist.  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  She had no synovitis on examination today.  Plan: Sedimentation rate, Cyclic citrul peptide antibody, IgG  Fibromyalgia -patient was diagnosed with fibromyalgia syndrome at the [facility] after she returned from [place].  She has had chronic pain and discomfort since then.  She has been under care of her neurologist for fibromyalgia.  She has been taking pain medications and Cymbalta which helps.  She ambulates with the help of a cane.  She is on on Cymbalta and oxycodone.  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  Patient states she had lost 88 pounds since then which has been helpful.  Essential hypertension-blood pressure was normal at 133/84 today.  Mixed hyperlipidemia - Followed by Dr. Gwenlyn Found  History of peripheral neuropathy-she continues to have numbness in her bilateral feet.  She has difficulty walking.  She is followed by her neurologist.  Frequent falls-patient gives history of frequent falls over the last 2 years.  She has been evaluated by her neurologist.  OSA (obstructive sleep apnea)-she currently not using CPAP.  Vitamin D deficiency-she takes vitamin D supplement.  Anxiety and depression-treated with Cymbalta.  PTSD (post-traumatic stress disorder)  Orders: Orders Placed This Encounter  Procedures   XR Hand 2 View Right   XR Hand 2 View Left   XR HIPS BILAT W OR W/O PELVIS  3-4 VIEWS   XR Foot 2 Views Right   XR Foot 2 Views Left   Sedimentation rate   Cyclic citrul peptide antibody, IgG   No  orders of the defined types were placed in this encounter.    Follow-Up Instructions: Return for Pain in multiple joints.   Bo Merino, MD  Note - This record has been created using Editor, commissioning.  Chart creation errors have been sought, but may not always  have been located. Such creation errors do not reflect on  the standard of medical care.

## 2022-07-05 ENCOUNTER — Ambulatory Visit: Payer: Medicare Other

## 2022-07-05 ENCOUNTER — Ambulatory Visit (INDEPENDENT_AMBULATORY_CARE_PROVIDER_SITE_OTHER): Payer: Medicare Other

## 2022-07-05 ENCOUNTER — Ambulatory Visit: Payer: Medicare Other | Attending: Rheumatology | Admitting: Rheumatology

## 2022-07-05 ENCOUNTER — Encounter: Payer: Self-pay | Admitting: Rheumatology

## 2022-07-05 VITALS — BP 133/84 | HR 92 | Resp 12 | Ht 60.0 in | Wt 142.0 lb

## 2022-07-05 DIAGNOSIS — I10 Essential (primary) hypertension: Secondary | ICD-10-CM | POA: Diagnosis not present

## 2022-07-05 DIAGNOSIS — G4733 Obstructive sleep apnea (adult) (pediatric): Secondary | ICD-10-CM | POA: Diagnosis not present

## 2022-07-05 DIAGNOSIS — M25552 Pain in left hip: Secondary | ICD-10-CM | POA: Diagnosis not present

## 2022-07-05 DIAGNOSIS — G8929 Other chronic pain: Secondary | ICD-10-CM | POA: Diagnosis not present

## 2022-07-05 DIAGNOSIS — G5603 Carpal tunnel syndrome, bilateral upper limbs: Secondary | ICD-10-CM

## 2022-07-05 DIAGNOSIS — M79641 Pain in right hand: Secondary | ICD-10-CM

## 2022-07-05 DIAGNOSIS — M25551 Pain in right hip: Secondary | ICD-10-CM

## 2022-07-05 DIAGNOSIS — E782 Mixed hyperlipidemia: Secondary | ICD-10-CM

## 2022-07-05 DIAGNOSIS — E559 Vitamin D deficiency, unspecified: Secondary | ICD-10-CM | POA: Diagnosis not present

## 2022-07-05 DIAGNOSIS — F419 Anxiety disorder, unspecified: Secondary | ICD-10-CM

## 2022-07-05 DIAGNOSIS — Z8669 Personal history of other diseases of the nervous system and sense organs: Secondary | ICD-10-CM | POA: Diagnosis not present

## 2022-07-05 DIAGNOSIS — Z9884 Bariatric surgery status: Secondary | ICD-10-CM | POA: Diagnosis not present

## 2022-07-05 DIAGNOSIS — M79642 Pain in left hand: Secondary | ICD-10-CM | POA: Diagnosis not present

## 2022-07-05 DIAGNOSIS — M79672 Pain in left foot: Secondary | ICD-10-CM

## 2022-07-05 DIAGNOSIS — R768 Other specified abnormal immunological findings in serum: Secondary | ICD-10-CM | POA: Diagnosis not present

## 2022-07-05 DIAGNOSIS — M797 Fibromyalgia: Secondary | ICD-10-CM | POA: Diagnosis not present

## 2022-07-05 DIAGNOSIS — M79671 Pain in right foot: Secondary | ICD-10-CM

## 2022-07-05 DIAGNOSIS — F32A Depression, unspecified: Secondary | ICD-10-CM

## 2022-07-05 DIAGNOSIS — F4310 Post-traumatic stress disorder, unspecified: Secondary | ICD-10-CM

## 2022-07-05 DIAGNOSIS — K50919 Crohn's disease, unspecified, with unspecified complications: Secondary | ICD-10-CM

## 2022-07-05 DIAGNOSIS — R296 Repeated falls: Secondary | ICD-10-CM

## 2022-07-05 DIAGNOSIS — R002 Palpitations: Secondary | ICD-10-CM

## 2022-07-09 DIAGNOSIS — E119 Type 2 diabetes mellitus without complications: Secondary | ICD-10-CM | POA: Diagnosis not present

## 2022-07-09 DIAGNOSIS — M793 Panniculitis, unspecified: Secondary | ICD-10-CM | POA: Diagnosis not present

## 2022-07-09 DIAGNOSIS — Z9884 Bariatric surgery status: Secondary | ICD-10-CM | POA: Diagnosis not present

## 2022-07-09 DIAGNOSIS — K912 Postsurgical malabsorption, not elsewhere classified: Secondary | ICD-10-CM | POA: Diagnosis not present

## 2022-07-09 DIAGNOSIS — Z48815 Encounter for surgical aftercare following surgery on the digestive system: Secondary | ICD-10-CM | POA: Diagnosis not present

## 2022-07-09 DIAGNOSIS — Z713 Dietary counseling and surveillance: Secondary | ICD-10-CM | POA: Diagnosis not present

## 2022-07-09 DIAGNOSIS — L574 Cutis laxa senilis: Secondary | ICD-10-CM | POA: Diagnosis not present

## 2022-07-09 LAB — SEDIMENTATION RATE: Sed Rate: 14 mm/h (ref 0–30)

## 2022-07-09 LAB — CYCLIC CITRUL PEPTIDE ANTIBODY, IGG: Cyclic Citrullin Peptide Ab: 46 UNITS — ABNORMAL HIGH

## 2022-07-18 DIAGNOSIS — G603 Idiopathic progressive neuropathy: Secondary | ICD-10-CM | POA: Diagnosis not present

## 2022-07-18 DIAGNOSIS — Z79899 Other long term (current) drug therapy: Secondary | ICD-10-CM | POA: Diagnosis not present

## 2022-07-20 NOTE — Progress Notes (Signed)
Office Visit Note  Patient: Bonnie Fox             Date of Birth: Aug 24, 1965           MRN: [id number]             PCP: [facility] Referring: Aletha Halim, MD Visit Date: 07/26/2022 Occupation: @GUAROCC @  Subjective:  Pain in multiple joints  History of Present Illness: Bonnie Fox is a 57 y.o. female with history of osteoarthritis and fibromyalgia syndrome.  She returns today after her initial visit on July 05, 2022.  She states she continues to have intermittent pain in her hands, hips and her feet.  She has not noticed any joint swelling.  She continues to have generalized pain and discomfort from fibromyalgia syndrome.  She has been followed by the neurologist.  She states Cymbalta helps.  She is currently not having a flare of fibromyalgia.  She has discomfort in her bilateral hands due to underlying carpal tunnel syndrome.    Activities of Daily Living:  Patient reports morning stiffness for 1 hour.   Patient Reports nocturnal pain.  Difficulty dressing/grooming: Denies Difficulty climbing stairs: Denies Difficulty getting out of chair: Denies Difficulty using hands for taps, buttons, cutlery, and/or writing: Denies  Review of Systems  Constitutional:  Negative for fatigue.  HENT:  Negative for mouth sores and mouth dryness.   Eyes:  Negative for dryness.  Respiratory:  Negative for shortness of breath.   Cardiovascular:  Negative for chest pain and palpitations.  Gastrointestinal:  Negative for blood in stool, constipation and diarrhea.  Endocrine: Negative for increased urination.  Genitourinary:  Negative for decreased urine output.  Musculoskeletal:  Negative for joint pain, joint pain, myalgias, morning stiffness and myalgias.  Skin:  Negative for color change, rash and sensitivity to sunlight.  Allergic/Immunologic: Negative for susceptible to infections.  Neurological:  Negative for dizziness and headaches.  Hematological:  Negative  for swollen glands.  Psychiatric/Behavioral:  Positive for depressed mood. Negative for sleep disturbance. The patient is nervous/anxious.     PMFS History:  Patient Active Problem List   Diagnosis Date Noted   Palpitations 11/18/2014   Hypokalemia 12/10/2013   Hyperlipidemia 12/10/2013   OSA (obstructive sleep apnea) 07/12/2011   CROHN'S DISEASE 01/23/2009   OBESITY 01/02/2009   Essential hypertension 01/02/2009   FOOT, PAIN 01/02/2009    Past Medical History:  Diagnosis Date   Anxiety    Depression    Diabetes (HCC)    Fibromyalgia    Hyperlipidemia    Hypertension    Hypokalemia    OSA (obstructive sleep apnea) 07/12/2011   PSG 07/22/11>>AHI 8.9, RDI 21.8, SpO2 low 90%.  Positional and REM effect.    Palpitations    PTSD (post-traumatic stress disorder)     Family History  Problem Relation Age of Onset   Diabetes Mother    Hypertension Mother    Diabetes Father    Prostate cancer Father    Kidney disease Father    Hypertension Sister    Hypertension Brother    Stroke Brother    Diabetes Brother    Past Surgical History:  Procedure Laterality Date   BREAST REDUCTION SURGERY     CESAREAN SECTION     CHOLECYSTECTOMY     LAPAROSCOPIC GASTRIC RESTRICTIVE DUODENAL PROCEDURE (DUODENAL SWITCH)     Social History   Social History Narrative   Not on file   Immunization History  Administered Date(s) Administered  COVID-19, mRNA, vaccine(Comirnaty)12 years and older 02/11/2022   Pfizer Covid-19 Vaccine Bivalent Booster 81yrs & up 03/08/2021     Objective: Vital Signs: BP (!) 159/99 (BP Location: Left Arm, Patient Position: Sitting, Cuff Size: Normal)   Pulse 91   Resp 12   Ht 5' (1.524 m)   Wt 139 lb 9.6 oz (63.3 kg)   BMI 27.26 kg/m    Physical Exam Vitals and nursing note reviewed.  Constitutional:      Appearance: She is well-developed.  HENT:     Head: Normocephalic and atraumatic.  Eyes:     Conjunctiva/sclera: Conjunctivae normal.   Cardiovascular:     Rate and Rhythm: Normal rate and regular rhythm.     Heart sounds: Normal heart sounds.  Pulmonary:     Effort: Pulmonary effort is normal.     Breath sounds: Normal breath sounds.  Abdominal:     General: Bowel sounds are normal.     Palpations: Abdomen is soft.  Musculoskeletal:     Cervical back: Normal range of motion.  Lymphadenopathy:     Cervical: No cervical adenopathy.  Skin:    General: Skin is warm and dry.     Capillary Refill: Capillary refill takes less than 2 seconds.  Neurological:     Mental Status: She is alert and oriented to person, place, and time.  Psychiatric:        Behavior: Behavior normal.      Musculoskeletal Exam: Cervical, thoracic and lumbar spine were in good range of motion.  Shoulder joints, elbow joints, wrist joints, MCPs and PIPs been good range of motion.  She had DIP thickening and decreased range of motion of her DIP joints.  Hip joints and knee joints were in good range of motion without any warmth swelling or effusion.  There was no tenderness over ankles or MTP joints.  CDAI Exam: CDAI Score: -- Patient Global: --; Provider Global: -- Swollen: --; Tender: -- Joint Exam 07/26/2022   No joint exam has been documented for this visit   There is currently no information documented on the homunculus. Go to the Rheumatology activity and complete the homunculus joint exam.  Investigation: No additional findings.  Imaging: XR HIPS BILAT W OR W/O PELVIS 3-4 VIEWS  Result Date: 07/05/2022 No hip joint narrowing was noted.  No SI joint narrowing was noted. Impression: Unremarkable x-rays of the hip joints.  XR Foot 2 Views Left  Result Date: 07/05/2022 PIP and DIP narrowing was noted.  No intertarsal, tibiotalar or subtalar joint space narrowing was noted.  Inferior calcaneal spur was noted.  No erosive changes were noted. Impression: These findings are suggestive of osteoarthritis of the foot.  XR Foot 2 Views  Right  Result Date: 07/05/2022 PIP and DIP narrowing was noted.  No intertarsal, tibiotalar or subtalar joint space narrowing was noted.  Inferior calcaneal spur was noted.  No erosive changes were noted. Impression: These findings are suggestive of osteoarthritis of the foot.  XR Hand 2 View Left  Result Date: 07/05/2022 CMC narrowing and subluxation was noted.  PIP and DIP narrowing was noted.  No MCP, intercarpal or radiocarpal joint space narrowing was noted.  No erosive changes were noted. Impression: These findings are suggestive of osteoarthritis of the hand.  XR Hand 2 View Right  Result Date: 07/05/2022 CMC narrowing and subluxation was noted.  PIP and DIP narrowing was noted.  No MCP, intercarpal or radiocarpal joint space narrowing was noted.  No erosive changes were  noted. Impression: These findings are suggestive of osteoarthritis of the hand.   Recent Labs: Lab Results  Component Value Date   WBC 8.6 05/20/2014   HGB 10.6 (L) 05/20/2014   PLT 345 05/20/2014   NA 136 05/20/2014   K 3.0 (L) 05/20/2014   CL 99 05/20/2014   CO2 29 05/20/2014   GLUCOSE 122 (H) 05/20/2014   BUN 16 05/20/2014   CREATININE 0.90 05/20/2014   BILITOT 0.4 12/10/2013   ALKPHOS 68 12/10/2013   AST 17 12/10/2013   ALT 17 12/10/2013   PROT 7.9 12/10/2013   ALBUMIN 4.7 12/10/2013   CALCIUM 9.1 05/20/2014   GFRAA 85 (L) 05/20/2014   July 05, 2022 ESR 14, anti-CCP 46 (moderate positive)  Speciality Comments: No specialty comments available.  Procedures:  No procedures performed Allergies: Gabapentin, Lyrica [pregabalin], Ibuprofen, Latex, and Penicillins   Assessment / Plan:     Visit Diagnoses: Primary osteoarthritis of both hands - History of discomfort and intermittent swelling.  No synovitis was noted.  Anti-CCP moderate positive.  X-rays were consistent with osteoarthritis.  X-ray findings were reviewed with the patient.  Detailed counseling osteoarthritis was provided.  Joint protection  muscle strengthening was discussed.  A handout on hand exercises were placed in the AVS.  Bilateral carpal tunnel syndrome - She is followed by neurology.  She uses carpal tunnel syndrome braces at night.  Chronic pain of both hips - She had good range of motion.  X-rays were unremarkable.  X-ray findings were reviewed with the patient.  Primary osteoarthritis of both feet - Patient has constant pain due to neuropathy.  There is no history of joint swelling.  X-rays were consistent with osteoarthritis.  X-ray findings were reviewed with the patient.  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  Association of anti-CCP with rheumatoid arthritis was discussed.  Association for rheumatoid arthritis with smoking and oral flora was also discussed.  Good dental hygiene was discussed.  At this point she does not have any clinical features of rheumatoid arthritis.  She was advised to contact us if she develops any joint swelling.  Fibromyalgia -she continues to have generalized pain and discomfort from fibromyalgia.  She is followed by neurologist at [facility].  She is on Cymbalta.  Benefits of water aerobics, swimming and stretching exercises were discussed.  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  She had intentional weight loss.  Essential hypertension-blood pressure was elevated at 159/99.  She was advised to monitor blood pressure closely and follow-up with the PCP.  Mixed hyperlipidemia  History of peripheral neuropathy-followed by neurology.  Frequent falls - Followed by neurology.  Vitamin D deficiency  Anxiety and depression - She is on Cymbalta.  PTSD (post-traumatic stress disorder)  OSA (obstructive sleep apnea)  Orders: No orders of the defined types were placed in this encounter.  No orders of the defined types were placed in this encounter.   Follow-Up Instructions:  Return in about 1 year (around 07/26/2023) for Osteoarthritis.   Pollyann Savoy, MD  Note - This record has been created using Animal nutritionist.  Chart creation errors have been sought, but may not always  have been located. Such creation errors do not reflect on  the standard of medical care.

## 2022-07-26 ENCOUNTER — Encounter: Payer: Self-pay | Admitting: Rheumatology

## 2022-07-26 ENCOUNTER — Ambulatory Visit: Payer: Medicare Other | Attending: Rheumatology | Admitting: Rheumatology

## 2022-07-26 ENCOUNTER — Telehealth: Payer: Self-pay | Admitting: Rheumatology

## 2022-07-26 VITALS — BP 150/92 | HR 89 | Resp 12 | Ht 60.0 in | Wt 139.6 lb

## 2022-07-26 DIAGNOSIS — Z9884 Bariatric surgery status: Secondary | ICD-10-CM

## 2022-07-26 DIAGNOSIS — R296 Repeated falls: Secondary | ICD-10-CM | POA: Diagnosis not present

## 2022-07-26 DIAGNOSIS — M19071 Primary osteoarthritis, right ankle and foot: Secondary | ICD-10-CM | POA: Diagnosis not present

## 2022-07-26 DIAGNOSIS — M19042 Primary osteoarthritis, left hand: Secondary | ICD-10-CM

## 2022-07-26 DIAGNOSIS — M797 Fibromyalgia: Secondary | ICD-10-CM

## 2022-07-26 DIAGNOSIS — Z8669 Personal history of other diseases of the nervous system and sense organs: Secondary | ICD-10-CM | POA: Diagnosis not present

## 2022-07-26 DIAGNOSIS — M19041 Primary osteoarthritis, right hand: Secondary | ICD-10-CM

## 2022-07-26 DIAGNOSIS — R768 Other specified abnormal immunological findings in serum: Secondary | ICD-10-CM | POA: Diagnosis not present

## 2022-07-26 DIAGNOSIS — G5603 Carpal tunnel syndrome, bilateral upper limbs: Secondary | ICD-10-CM | POA: Diagnosis not present

## 2022-07-26 DIAGNOSIS — M19072 Primary osteoarthritis, left ankle and foot: Secondary | ICD-10-CM

## 2022-07-26 DIAGNOSIS — M25551 Pain in right hip: Secondary | ICD-10-CM

## 2022-07-26 DIAGNOSIS — I10 Essential (primary) hypertension: Secondary | ICD-10-CM | POA: Diagnosis not present

## 2022-07-26 DIAGNOSIS — E782 Mixed hyperlipidemia: Secondary | ICD-10-CM

## 2022-07-26 DIAGNOSIS — G4733 Obstructive sleep apnea (adult) (pediatric): Secondary | ICD-10-CM

## 2022-07-26 DIAGNOSIS — F32A Depression, unspecified: Secondary | ICD-10-CM

## 2022-07-26 DIAGNOSIS — F419 Anxiety disorder, unspecified: Secondary | ICD-10-CM

## 2022-07-26 DIAGNOSIS — E559 Vitamin D deficiency, unspecified: Secondary | ICD-10-CM | POA: Diagnosis not present

## 2022-07-26 DIAGNOSIS — M25552 Pain in left hip: Secondary | ICD-10-CM

## 2022-07-26 DIAGNOSIS — F4310 Post-traumatic stress disorder, unspecified: Secondary | ICD-10-CM

## 2022-07-26 DIAGNOSIS — G8929 Other chronic pain: Secondary | ICD-10-CM

## 2022-07-26 NOTE — Telephone Encounter (Signed)
Patient needs office visit notes faxed to her case manager Hilda Lias at [phone]

## 2022-07-26 NOTE — Telephone Encounter (Signed)
Office note faxed as requested.

## 2022-07-26 NOTE — Patient Instructions (Addendum)
Osteoarthritis  Osteoarthritis is a type of arthritis. It refers to joint pain or joint disease. Osteoarthritis affects tissue that covers the ends of bones in joints (cartilage). Cartilage acts as a cushion between the bones and helps them move smoothly. Osteoarthritis occurs when cartilage in the joints gets worn down. Osteoarthritis is sometimes called "wear and tear" arthritis. Osteoarthritis is the most common form of arthritis. It often occurs in older people. It is a condition that gets worse over time. The joints most often affected by this condition are in the fingers, toes, hips, knees, and spine, including the neck and lower back. What are the causes? This condition is caused by the wearing down of cartilage that covers the ends of bones. What increases the risk? The following factors may make you more likely to develop this condition: Being age 50 or older. Obesity. Overuse of joints. Past injury of a joint. Past surgery on a joint. Family history of osteoarthritis. What are the signs or symptoms? The main symptoms of this condition are pain, swelling, and stiffness in the joint. Other symptoms may include: An enlarged joint. More pain and further damage caused by small pieces of bone or cartilage that break off and float inside of the joint. Small deposits of bone (osteophytes) that grow on the edges of the joint. A grating or scraping feeling inside the joint when you move it. Popping or creaking sounds when you move. Difficulty walking or exercising. An inability to grip items, twist your hand, or control the movements of your hands and fingers. How is this diagnosed? This condition may be diagnosed based on: Your medical history. A physical exam. Your symptoms. X-rays of the affected joints. Blood tests to rule out other types of arthritis. How is this treated? There is no cure for this condition, but treatment can help control pain and improve joint function. Treatment  may include a combination of therapies, such as: Pain relief techniques, such as: Applying heat and cold to the joint. Massage. A form of talk therapy called cognitive behavioral therapy (CBT). This therapy helps you set goals and follow up on the changes that you make. Medicines for pain and inflammation. The medicines can be taken by mouth or applied to the skin. They include: NSAIDs, such as ibuprofen. Prescription medicines. Strong anti-inflammatory medicines (corticosteroids). Certain nutritional supplements. A prescribed exercise program. You may work with a physical therapist. Assistive devices, such as a brace, wrap, splint, specialized glove, or cane. A weight control plan. Surgery, such as: An osteotomy. This is done to reposition the bones and relieve pain or to remove loose pieces of bone and cartilage. Joint replacement surgery. You may need this surgery if you have advanced osteoarthritis. Follow these instructions at home: Activity Rest your affected joints as told by your health care provider. Exercise as told by your provider. The provider may recommend specific types of exercise, such as: Strengthening exercises. These are done to strengthen the muscles that support joints affected by arthritis. Aerobic activities. These are exercises, such as brisk walking or water aerobics, that increase your heart rate. Range-of-motion activities. These help your joints move more easily. Balance and agility exercises. Managing pain, stiffness, and swelling     If told, apply heat to the affected area as often as told by your provider. Use the heat source that your provider recommends, such as a moist heat pack or a heating pad. If you have a removable assistive device, remove it as told by your provider. Place a   towel between your skin and the heat source. If your provider tells you to keep the assistive device on while you apply heat, place a towel between the assistive device and  the heat source. Leave the heat on for 20-30 minutes. If told, put ice on the affected area. If you have a removable assistive device, remove it as told by your provider. Put ice in a plastic bag. Place a towel between your skin and the bag. If your provider tells you to keep the assistive device on during icing, place a towel between the assistive device and the bag. Leave the ice on for 20 minutes, 2-3 times a day. If your skin turns bright red, remove the ice or heat right away to prevent skin damage. The risk of damage is higher if you cannot feel pain, heat, or cold. Move your fingers or toes often to reduce stiffness and swelling. Raise (elevate) the affected area above the level of your heart while you are sitting or lying down. General instructions Take over-the-counter and prescription medicines only as told by your provider. Maintain a healthy weight. Follow instructions from your provider for weight control. Do not use any products that contain nicotine or tobacco. These products include cigarettes, chewing tobacco, and vaping devices, such as e-cigarettes. If you need help quitting, ask your provider. Use assistive devices as told by your provider. Where to find more information National Institute of Arthritis and Musculoskeletal and Skin Diseases: niams.nih.gov National Institute on Aging: nia.nih.gov American College of Rheumatology: rheumatology.org Contact a health care provider if: You have redness, swelling, or a feeling of warmth in a joint that gets worse. You have a fever along with joint or muscle aches. You develop a rash. You have trouble doing your normal activities. You have pain that gets worse and is not relieved by pain medicine. This information is not intended to replace advice given to you by your health care provider. Make sure you discuss any questions you have with your health care provider. Document Revised: 12/06/2021 Document Reviewed:  12/06/2021 Elsevier Patient Education  2023 Elsevier Inc. Hand Exercises Hand exercises can be helpful for almost anyone. These exercises can strengthen the hands, improve flexibility and movement, and increase blood flow to the hands. These results can make work and daily tasks easier. Hand exercises can be especially helpful for people who have joint pain from arthritis or have nerve damage from overuse (carpal tunnel syndrome). These exercises can also help people who have injured a hand. Exercises Most of these hand exercises are gentle stretching and motion exercises. It is usually safe to do them often throughout the day. Warming up your hands before exercise may help to reduce stiffness. You can do this with gentle massage or by placing your hands in warm water for 10-15 minutes. It is normal to feel some stretching, pulling, tightness, or mild discomfort as you begin new exercises. This will gradually improve. Stop an exercise right away if you feel sudden, severe pain or your pain gets worse. Ask your health care provider which exercises are best for you. Knuckle bend or "claw" fist  Stand or sit with your arm, hand, and all five fingers pointed straight up. Make sure to keep your wrist straight during the exercise. Gently bend your fingers down toward your palm until the tips of your fingers are touching the top of your palm. Keep your big knuckle straight and just bend the small knuckles in your fingers. Hold this position for __________ seconds.   Straighten (extend) your fingers back to the starting position. Repeat this exercise 5-10 times with each hand. Full finger fist  Stand or sit with your arm, hand, and all five fingers pointed straight up. Make sure to keep your wrist straight during the exercise. Gently bend your fingers into your palm until the tips of your fingers are touching the middle of your palm. Hold this position for __________ seconds. Extend your fingers back to  the starting position, stretching every joint fully. Repeat this exercise 5-10 times with each hand. Straight fist Stand or sit with your arm, hand, and all five fingers pointed straight up. Make sure to keep your wrist straight during the exercise. Gently bend your fingers at the big knuckle, where your fingers meet your hand, and the middle knuckle. Keep the knuckle at the tips of your fingers straight and try to touch the bottom of your palm. Hold this position for __________ seconds. Extend your fingers back to the starting position, stretching every joint fully. Repeat this exercise 5-10 times with each hand. Tabletop  Stand or sit with your arm, hand, and all five fingers pointed straight up. Make sure to keep your wrist straight during the exercise. Gently bend your fingers at the big knuckle, where your fingers meet your hand, as far down as you can while keeping the small knuckles in your fingers straight. Think of forming a tabletop with your fingers. Hold this position for __________ seconds. Extend your fingers back to the starting position, stretching every joint fully. Repeat this exercise 5-10 times with each hand. Finger spread  Place your hand flat on a table with your palm facing down. Make sure your wrist stays straight as you do this exercise. Spread your fingers and thumb apart from each other as far as you can until you feel a gentle stretch. Hold this position for __________ seconds. Bring your fingers and thumb tight together again. Hold this position for __________ seconds. Repeat this exercise 5-10 times with each hand. Making circles  Stand or sit with your arm, hand, and all five fingers pointed straight up. Make sure to keep your wrist straight during the exercise. Make a circle by touching the tip of your thumb to the tip of your index finger. Hold for __________ seconds. Then open your hand wide. Repeat this motion with your thumb and each finger on your  hand. Repeat this exercise 5-10 times with each hand. Thumb motion  Sit with your forearm resting on a table and your wrist straight. Your thumb should be facing up toward the ceiling. Keep your fingers relaxed as you move your thumb. Lift your thumb up as high as you can toward the ceiling. Hold for __________ seconds. Bend your thumb across your palm as far as you can, reaching the tip of your thumb for the small finger (pinkie) side of your palm. Hold for __________ seconds. Repeat this exercise 5-10 times with each hand. Grip strengthening  Hold a stress ball or other soft ball in the middle of your hand. Slowly increase the pressure, squeezing the ball as much as you can without causing pain. Think of bringing the tips of your fingers into the middle of your palm. All of your finger joints should bend when doing this exercise. Hold your squeeze for __________ seconds, then relax. Repeat this exercise 5-10 times with each hand. Contact a health care provider if: Your hand pain or discomfort gets much worse when you do an exercise. Your hand pain   or discomfort does not improve within 2 hours after you exercise. If you have any of these problems, stop doing these exercises right away. Do not do them again unless your health care provider says that you can. Get help right away if: You develop sudden, severe hand pain or swelling. If this happens, stop doing these exercises right away. Do not do them again unless your health care provider says that you can. This information is not intended to replace advice given to you by your health care provider. Make sure you discuss any questions you have with your health care provider. Document Revised: 07/20/2020 Document Reviewed: 07/27/2020 Elsevier Patient Education  2023 Elsevier Inc.  

## 2022-07-30 ENCOUNTER — Other Ambulatory Visit: Payer: Self-pay | Admitting: Specialist

## 2022-07-30 DIAGNOSIS — M5417 Radiculopathy, lumbosacral region: Secondary | ICD-10-CM

## 2022-07-30 DIAGNOSIS — M545 Low back pain, unspecified: Secondary | ICD-10-CM

## 2022-08-03 ENCOUNTER — Ambulatory Visit
Admission: EM | Admit: 2022-08-03 | Discharge: 2022-08-03 | Disposition: A | Discharge status: Home / Self Care | Payer: Medicare Other

## 2022-08-03 DIAGNOSIS — F1120 Opioid dependence, uncomplicated: Secondary | ICD-10-CM | POA: Diagnosis not present

## 2022-08-03 DIAGNOSIS — M797 Fibromyalgia: Secondary | ICD-10-CM

## 2022-08-03 MED ORDER — OXYCODONE HCL 5 MG PO TABS: 5.0000 mg | ORAL_TABLET | Freq: Four times a day (QID) | ORAL | 0 refills | Status: AC | PRN

## 2022-08-03 NOTE — ED Provider Notes (Signed)
[facility]   [id number] 08/03/22 Arrival Time: 1449   Chief Complaint  Patient presents with   Pain     SUBJECTIVE: History from: patient.  Bonnie Fox is a 57 y.o. female who presents to the urgent care for complaint of fibromyalgia pain that has been worsening for the past few days.  Reports she was seen at the [place] on the left and then was prescribed oxycodone 5 mg tablet.  She received  20 tablet. She has been taking 10 mg q6h instead.  She was referred to follow-up with pain clinic and is currently pending.  She states she only have 2 pills left.  Reports taking oxycodone 15 mg help improve her symptoms.  Denies any other aggravating factors.  Denies shortness of breath, chest pain, nausea, vomiting, diarrhea.   ROS: As per HPI.  All other pertinent ROS negative.      Past Medical History:  Diagnosis Date   Anxiety    Depression    Diabetes    Fibromyalgia    Hyperlipidemia    Hypertension    Hypokalemia    OSA (obstructive sleep apnea) 07/12/2011   PSG 07/22/11>>AHI 8.9, RDI 21.8, SpO2 low 90%.  Positional and REM effect.    Palpitations    PTSD (post-traumatic stress disorder)    Past Surgical History:  Procedure Laterality Date   BREAST REDUCTION SURGERY     CESAREAN SECTION     CHOLECYSTECTOMY     LAPAROSCOPIC GASTRIC RESTRICTIVE DUODENAL PROCEDURE (DUODENAL SWITCH)     Allergies  Allergen Reactions   Gabapentin Other (See Comments)    Makes pt feel "awful"   Lyrica [Pregabalin] Other (See Comments)    Makes pt feel "awful" makes pain worse   Ibuprofen Swelling and Rash   Latex Swelling and Rash   Penicillins Swelling and Rash   No current facility-administered medications on file prior to encounter.   Current Outpatient Medications on File Prior to Encounter  Medication Sig Dispense Refill   amLODipine (NORVASC) 10 MG tablet Take 5 mg by mouth daily.     Cholecalciferol (VITAMIN D3) 50000 UNITS CAPS Take 1 capsule by mouth once a week.   6   DULoxetine (CYMBALTA) 60 MG capsule Take 60 mg by mouth 2 (two) times daily.      lidocaine (LIDODERM) 5 % Place onto the skin.     lisinopril (PRINIVIL,ZESTRIL) 40 MG tablet Take 20 mg by mouth daily.     MOUNJARO 7.5 MG/0.5ML Pen Inject into the skin.     nystatin (MYCOSTATIN/NYSTOP) powder Apply topically 2 (two) times daily.     polyethylene glycol powder (GLYCOLAX/MIRALAX) 17 GM/SCOOP powder MIX AND DRINK BY MOUTH EVERY DAY AS NEEDED MIX 17 GRAMS (USE BOTTLE CAP) IN LIQUID OF CHOICE IN 4 TO 8 OUNCES OF FLUID PRIOR TO TAKING AS DIRECTED FOR CONSTIPATION     potassium chloride SA (KLOR-CON M) 20 MEQ tablet Take 20 mEq by mouth once.     potassium citrate (UROCIT-K) 10 MEQ (1080 MG) SR tablet Take by mouth.     thiamine (VITAMIN B1) 100 MG tablet Take 200 mg by mouth daily.     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  Tobacco Use   Smoking status: Never   Smokeless tobacco: Never  Vaping Use   Vaping Use: Never used  Substance and Sexual Activity   Alcohol use: No   Drug use: No   Sexual activity: Not on file  Other Topics Concern   Not on file  Social History Narrative   Not on file   Social Determinants of Health   Financial Resource Strain: Not on file  Food Insecurity: Not on file  Transportation Needs: Not on file  Physical Activity: Not on file  Stress: Not on file  Social Connections: Not on file  Intimate Partner Violence: Not on file   Family History  Problem Relation Age of Onset   Diabetes Mother    Hypertension Mother    Diabetes Father    Prostate cancer Father    Kidney disease Father    Hypertension  Sister    Hypertension Brother    Stroke Brother    Diabetes Brother     OBJECTIVE:  Vitals:   08/03/22 1530  BP: 109/73  Pulse: (!) 123  Resp: 19  Temp: (!) 97.2 F (36.2 C)  SpO2: 98%     Physical Exam Vitals and nursing note reviewed.  Constitutional:      General: She is not in acute distress.    Appearance: Normal appearance. She is normal weight. She is not ill-appearing, toxic-appearing or diaphoretic.  HENT:     Head: Normocephalic.  Cardiovascular:     Rate and Rhythm: Normal rate and regular rhythm.     Pulses: Normal pulses.     Heart sounds: Normal heart sounds. No murmur heard.    No friction rub. No gallop.  Pulmonary:     Effort: Pulmonary effort is normal. No respiratory distress.     Breath sounds: Normal breath sounds. No stridor. No wheezing, rhonchi or rales.  Chest:     Chest wall: No tenderness.  Neurological:     Mental Status: She is alert and oriented to person, place, and time.      LABS:  No results found for this or any previous visit (from the past 24 hour(s)).   ASSESSMENT & PLAN:  1. Fibromyalgia   2. Chronic narcotic dependence     Meds ordered this encounter  Medications   oxyCODONE (OXY IR/ROXICODONE) 5 MG immediate release tablet    Sig: Take 1 tablet (5 mg total) by mouth every 6 (six) hours as needed for up to 3 days for severe pain.    Dispense:  12 tablet    Refill:  0   Discharge instructions  Med[facility]illed [facility] If your symptoms worsen please go to the ER  Reviewed expectations re: course of current medical issues. Questions answered. Outlined signs and symptoms indicating need for more acute intervention. Patient verbalized understanding. After Visit Summary given.   PDMP reviewed during this encounter.        Durward Parcel, FNP 08/03/22 1600

## 2022-08-03 NOTE — Discharge Instructions (Signed)
Medication was refilled [facility] If your symptoms worsen please go to the ER

## 2022-08-03 NOTE — ED Triage Notes (Addendum)
Pt presents with pain from her chronic fibromyalgia with pain right side, left shoulder and bilateral calf and leg pain. Pt reports she takes oxycodone for 10 yrs and her rx ended April 4th. She was seen on Thursday and was given an rx for 5mg  and she has been taking that in the meantime more than prescribed reports only having 2 pills left. In process of getting into pain specialist. She has had shots for pain management. Pt reports 9/10 pain. Good day is 7/10.

## 2022-08-14 ENCOUNTER — Ambulatory Visit: Payer: Medicare Other | Admitting: Internal Medicine

## 2022-08-14 ENCOUNTER — Encounter: Payer: Self-pay | Admitting: Internal Medicine

## 2022-08-14 VITALS — BP 130/80 | HR 78 | Temp 97.0°F | Resp 18 | Ht 61.0 in | Wt 137.0 lb

## 2022-08-14 DIAGNOSIS — M797 Fibromyalgia: Secondary | ICD-10-CM | POA: Diagnosis not present

## 2022-08-14 DIAGNOSIS — Z6825 Body mass index (BMI) 25.0-25.9, adult: Secondary | ICD-10-CM

## 2022-08-14 DIAGNOSIS — I10 Essential (primary) hypertension: Secondary | ICD-10-CM

## 2022-08-14 DIAGNOSIS — E785 Hyperlipidemia, unspecified: Secondary | ICD-10-CM

## 2022-08-14 DIAGNOSIS — E1169 Type 2 diabetes mellitus with other specified complication: Secondary | ICD-10-CM | POA: Insufficient documentation

## 2022-08-14 MED ORDER — REPATHA 140 MG/ML ~~LOC~~ SOSY: 140.0000 mg | PREFILLED_SYRINGE | SUBCUTANEOUS | 6 refills | Status: DC

## 2022-08-14 NOTE — Assessment & Plan Note (Signed)
Controlled.  

## 2022-08-14 NOTE — Assessment & Plan Note (Signed)
Her sugar is well controlled, and will evaluate after review of her cholesterol.

## 2022-08-14 NOTE — Progress Notes (Signed)
New Patient Office Visit  Subjective    Patient ID: Bonnie Fox, female    DOB: [birth year]  Age: 57 y.o. MRN: [id number]  CC:  Chief Complaint  Patient presents with   New Patient (Initial Visit)    New patient     HPI Bonnie Fox presents to establish care Is here to establish care with [name]. She has diabetes, hypertension and hyperlipidemia and she could not tolerate 4-5 medications for high cholesterol so she is not taking any medication. She does not check her sugar, her HbA1c 2 weeks ago at [place] was 5.0% per patient. She take Mounjaro 7.5 q weekly.  She also has hypertension. She take amlodipine and lisinopril 40 mg daily. Her blood pressure is better.  She has cymbalta 60 mg twice a day and oxycodone for fibromyalgia.  She is disable due to fibromyalgia.  She is divorce, she has one daughter. She does nt smoke and does not drink.   Outpatient Encounter Medications as of 08/14/2022  Medication Sig   oxyCODONE (OXY IR/ROXICODONE) 5 MG immediate release tablet TAKE 1 TABLET BY MOUTH FOUR TIMES A DAY AS NEEDED FOR PAIN   amLODipine (NORVASC) 10 MG tablet Take 5 mg by mouth daily.   Cholecalciferol (VITAMIN D3) 50000 UNITS CAPS Take 1 capsule by mouth once a week.   DULoxetine (CYMBALTA) 60 MG capsule Take 60 mg by mouth 2 (two) times daily.    lidocaine (LIDODERM) 5 % Place onto the skin.   lisinopril (PRINIVIL,ZESTRIL) 40 MG tablet Take 20 mg by mouth daily.   MOUNJARO 7.5 MG/0.5ML Pen Inject into the skin.   nystatin (MYCOSTATIN/NYSTOP) powder Apply topically 2 (two) times daily.   polyethylene glycol powder (GLYCOLAX/MIRALAX) 17 GM/SCOOP powder MIX AND DRINK BY MOUTH EVERY DAY AS NEEDED MIX 17 GRAMS (USE BOTTLE CAP) IN LIQUID OF CHOICE IN 4 TO 8 OUNCES OF FLUID PRIOR TO TAKING AS DIRECTED FOR CONSTIPATION   potassium chloride SA (KLOR-CON M) 20 MEQ tablet Take 20 mEq by mouth once.   potassium citrate (UROCIT-K) 10 MEQ (1080 MG) SR tablet Take by mouth.   thiamine  (VITAMIN B1) 100 MG tablet Take 200 mg by mouth daily.   triamcinolone cream (KENALOG) 0.1 % APPLY MODERATE AMOUNT TOPICALLY TWO TIMES A DAY AS NEEDED FOR ITCHING   No facility-administered encounter medications on file as of 08/14/2022.    Past Medical History:  Diagnosis Date   Anxiety    Depression    Diabetes    Fibromyalgia    Hyperlipidemia    Hypertension    Hypokalemia    OSA (obstructive sleep apnea) 07/12/2011   PSG 07/22/11>>AHI 8.9, RDI 21.8, SpO2 low 90%.  Positional and REM effect.    Palpitations    PTSD (post-traumatic stress disorder)     Past Surgical History:  Procedure Laterality Date   BREAST REDUCTION SURGERY     CESAREAN SECTION     CHOLECYSTECTOMY     LAPAROSCOPIC GASTRIC RESTRICTIVE DUODENAL PROCEDURE (DUODENAL SWITCH)      Family History  Problem Relation Age of Onset   Diabetes Mother    Hypertension Mother    Diabetes Father    Prostate cancer Father    Kidney disease Father    Hypertension Sister    Hypertension Brother    Stroke Brother    Diabetes Brother     Social History   Socioeconomic History   Marital status: Legally Separated    Spouse name: Not on file  Number of children: 1   Years of education: Not on file   Highest education level: Not on file  Occupational History   Occupation: assembly    Employer: Advertising copywriter  Tobacco Use   Smoking status: Never   Smokeless tobacco: Never  Vaping Use   Vaping Use: Never used  Substance and Sexual Activity   Alcohol use: No   Drug use: No   Sexual activity: Not on file  Other Topics Concern   Not on file  Social History Narrative   Not on file   Social Determinants of Health   Financial Resource Strain: Not on file  Food Insecurity: Not on file  Transportation Needs: Not on file  Physical Activity: Not on file  Stress: Not on file  Social Connections: Not on file  Intimate Partner Violence: Not on file    Review of Systems  Constitutional: Negative.    HENT: Negative.    Eyes: Negative.   Respiratory: Negative.    Cardiovascular: Negative.   Gastrointestinal: Negative.   Musculoskeletal:  Positive for myalgias.  Neurological: Negative.         Objective    BP 130/80 (BP Location: Left Arm, Patient Position: Sitting, Cuff Size: Normal)   Pulse 78   Temp (!) 97 F (36.1 C)   Resp 18   Ht  (1.549 m)   Wt 137 lb (62.1 kg)   SpO2 97%   BMI 25.89 kg/m   Physical Exam Constitutional:      Appearance: Normal appearance.  HENT:     Head: Normocephalic and atraumatic.  Cardiovascular:     Rate and Rhythm: Normal rate and regular rhythm.     Heart sounds: Normal heart sounds.  Pulmonary:     Effort: Pulmonary effort is normal.     Breath sounds: Normal breath sounds.  Abdominal:     General: Bowel sounds are normal.     Palpations: Abdomen is soft.  Neurological:     Mental Status: She is alert.         Assessment & Plan:   Problem List Items Addressed This Visit       Cardiovascular and Mediastinum   Essential hypertension - Primary    Controlled.        Endocrine   Dyslipidemia associated with type 2 diabetes mellitus    Her sugar is well controlled, and will evaluate after review of her cholesterol.        Other   Fibromyalgia    She is on cymbalta and oxycodone. I  will refer her to pain clinic for pain management.      Relevant Medications   oxyCODONE (OXY IR/ROXICODONE) 5 MG immediate release tablet    Return in about 3 months (around 11/13/2022).   Bonnie Northern, MD

## 2022-08-14 NOTE — Assessment & Plan Note (Signed)
She is on cymbalta and oxycodone. I  will refer her to pain clinic for pain management.

## 2022-08-19 DIAGNOSIS — Z79891 Long term (current) use of opiate analgesic: Secondary | ICD-10-CM | POA: Diagnosis not present

## 2022-09-16 DIAGNOSIS — E118 Type 2 diabetes mellitus with unspecified complications: Secondary | ICD-10-CM | POA: Diagnosis not present

## 2022-09-16 DIAGNOSIS — M797 Fibromyalgia: Secondary | ICD-10-CM | POA: Diagnosis not present

## 2022-09-16 DIAGNOSIS — I10 Essential (primary) hypertension: Secondary | ICD-10-CM | POA: Diagnosis not present

## 2022-09-16 DIAGNOSIS — M5136 Other intervertebral disc degeneration, lumbar region: Secondary | ICD-10-CM | POA: Diagnosis not present

## 2022-09-16 DIAGNOSIS — Z77098 Contact with and (suspected) exposure to other hazardous, chiefly nonmedicinal, chemicals: Secondary | ICD-10-CM | POA: Diagnosis not present

## 2022-09-16 DIAGNOSIS — Z91199 Patient's noncompliance with other medical treatment and regimen due to unspecified reason: Secondary | ICD-10-CM | POA: Diagnosis not present

## 2022-09-16 DIAGNOSIS — G8929 Other chronic pain: Secondary | ICD-10-CM | POA: Diagnosis not present

## 2022-09-16 DIAGNOSIS — M5440 Lumbago with sciatica, unspecified side: Secondary | ICD-10-CM | POA: Diagnosis not present

## 2022-09-16 DIAGNOSIS — M545 Low back pain, unspecified: Secondary | ICD-10-CM | POA: Diagnosis not present

## 2022-09-16 DIAGNOSIS — Z79899 Other long term (current) drug therapy: Secondary | ICD-10-CM | POA: Diagnosis not present

## 2022-09-16 DIAGNOSIS — Z79891 Long term (current) use of opiate analgesic: Secondary | ICD-10-CM | POA: Diagnosis not present

## 2022-10-02 DIAGNOSIS — M5136 Other intervertebral disc degeneration, lumbar region: Secondary | ICD-10-CM | POA: Diagnosis not present

## 2022-10-02 DIAGNOSIS — M5440 Lumbago with sciatica, unspecified side: Secondary | ICD-10-CM | POA: Diagnosis not present

## 2022-10-02 DIAGNOSIS — M545 Low back pain, unspecified: Secondary | ICD-10-CM | POA: Diagnosis not present

## 2022-10-02 DIAGNOSIS — E118 Type 2 diabetes mellitus with unspecified complications: Secondary | ICD-10-CM | POA: Diagnosis not present

## 2022-10-02 DIAGNOSIS — Z77098 Contact with and (suspected) exposure to other hazardous, chiefly nonmedicinal, chemicals: Secondary | ICD-10-CM | POA: Diagnosis not present

## 2022-10-02 DIAGNOSIS — Z91199 Patient's noncompliance with other medical treatment and regimen due to unspecified reason: Secondary | ICD-10-CM | POA: Diagnosis not present

## 2022-10-02 DIAGNOSIS — G894 Chronic pain syndrome: Secondary | ICD-10-CM | POA: Diagnosis not present

## 2022-10-02 DIAGNOSIS — I10 Essential (primary) hypertension: Secondary | ICD-10-CM | POA: Diagnosis not present

## 2022-10-02 DIAGNOSIS — M797 Fibromyalgia: Secondary | ICD-10-CM | POA: Diagnosis not present

## 2022-10-02 DIAGNOSIS — Z79899 Other long term (current) drug therapy: Secondary | ICD-10-CM | POA: Diagnosis not present

## 2022-10-02 DIAGNOSIS — Z79891 Long term (current) use of opiate analgesic: Secondary | ICD-10-CM | POA: Diagnosis not present

## 2022-10-30 DIAGNOSIS — Z79899 Other long term (current) drug therapy: Secondary | ICD-10-CM | POA: Diagnosis not present

## 2022-10-30 DIAGNOSIS — M545 Low back pain, unspecified: Secondary | ICD-10-CM | POA: Diagnosis not present

## 2022-10-30 DIAGNOSIS — Z91199 Patient's noncompliance with other medical treatment and regimen due to unspecified reason: Secondary | ICD-10-CM | POA: Diagnosis not present

## 2022-10-30 DIAGNOSIS — I10 Essential (primary) hypertension: Secondary | ICD-10-CM | POA: Diagnosis not present

## 2022-10-30 DIAGNOSIS — M797 Fibromyalgia: Secondary | ICD-10-CM | POA: Diagnosis not present

## 2022-10-30 DIAGNOSIS — Z77098 Contact with and (suspected) exposure to other hazardous, chiefly nonmedicinal, chemicals: Secondary | ICD-10-CM | POA: Diagnosis not present

## 2022-10-30 DIAGNOSIS — Z79891 Long term (current) use of opiate analgesic: Secondary | ICD-10-CM | POA: Diagnosis not present

## 2022-10-30 DIAGNOSIS — G894 Chronic pain syndrome: Secondary | ICD-10-CM | POA: Diagnosis not present

## 2022-10-30 DIAGNOSIS — M5136 Other intervertebral disc degeneration, lumbar region: Secondary | ICD-10-CM | POA: Diagnosis not present

## 2022-10-30 DIAGNOSIS — M5440 Lumbago with sciatica, unspecified side: Secondary | ICD-10-CM | POA: Diagnosis not present

## 2022-10-31 DIAGNOSIS — Z9884 Bariatric surgery status: Secondary | ICD-10-CM | POA: Diagnosis not present

## 2022-10-31 DIAGNOSIS — Z713 Dietary counseling and surveillance: Secondary | ICD-10-CM | POA: Diagnosis not present

## 2022-10-31 DIAGNOSIS — I10 Essential (primary) hypertension: Secondary | ICD-10-CM | POA: Diagnosis not present

## 2022-10-31 DIAGNOSIS — K912 Postsurgical malabsorption, not elsewhere classified: Secondary | ICD-10-CM | POA: Diagnosis not present

## 2022-10-31 DIAGNOSIS — Z48815 Encounter for surgical aftercare following surgery on the digestive system: Secondary | ICD-10-CM | POA: Diagnosis not present

## 2022-10-31 DIAGNOSIS — E119 Type 2 diabetes mellitus without complications: Secondary | ICD-10-CM | POA: Diagnosis not present

## 2022-11-14 ENCOUNTER — Ambulatory Visit: Payer: Medicare Other | Admitting: Internal Medicine

## 2022-12-02 DIAGNOSIS — G894 Chronic pain syndrome: Secondary | ICD-10-CM | POA: Diagnosis not present

## 2022-12-02 DIAGNOSIS — Z79899 Other long term (current) drug therapy: Secondary | ICD-10-CM | POA: Diagnosis not present

## 2022-12-02 DIAGNOSIS — Z91199 Patient's noncompliance with other medical treatment and regimen due to unspecified reason: Secondary | ICD-10-CM | POA: Diagnosis not present

## 2022-12-02 DIAGNOSIS — E118 Type 2 diabetes mellitus with unspecified complications: Secondary | ICD-10-CM | POA: Diagnosis not present

## 2022-12-02 DIAGNOSIS — Z77098 Contact with and (suspected) exposure to other hazardous, chiefly nonmedicinal, chemicals: Secondary | ICD-10-CM | POA: Diagnosis not present

## 2022-12-02 DIAGNOSIS — Z79891 Long term (current) use of opiate analgesic: Secondary | ICD-10-CM | POA: Diagnosis not present

## 2022-12-02 DIAGNOSIS — M545 Low back pain, unspecified: Secondary | ICD-10-CM | POA: Diagnosis not present

## 2022-12-02 DIAGNOSIS — M5440 Lumbago with sciatica, unspecified side: Secondary | ICD-10-CM | POA: Diagnosis not present

## 2022-12-02 DIAGNOSIS — I10 Essential (primary) hypertension: Secondary | ICD-10-CM | POA: Diagnosis not present

## 2022-12-02 DIAGNOSIS — M5136 Other intervertebral disc degeneration, lumbar region: Secondary | ICD-10-CM | POA: Diagnosis not present

## 2022-12-02 DIAGNOSIS — M797 Fibromyalgia: Secondary | ICD-10-CM | POA: Diagnosis not present

## 2023-01-06 DIAGNOSIS — Z91199 Patient's noncompliance with other medical treatment and regimen due to unspecified reason: Secondary | ICD-10-CM | POA: Diagnosis not present

## 2023-01-06 DIAGNOSIS — Z77098 Contact with and (suspected) exposure to other hazardous, chiefly nonmedicinal, chemicals: Secondary | ICD-10-CM | POA: Diagnosis not present

## 2023-01-06 DIAGNOSIS — Z79899 Other long term (current) drug therapy: Secondary | ICD-10-CM | POA: Diagnosis not present

## 2023-01-06 DIAGNOSIS — Z79891 Long term (current) use of opiate analgesic: Secondary | ICD-10-CM | POA: Diagnosis not present

## 2023-01-06 DIAGNOSIS — M797 Fibromyalgia: Secondary | ICD-10-CM | POA: Diagnosis not present

## 2023-01-06 DIAGNOSIS — M5440 Lumbago with sciatica, unspecified side: Secondary | ICD-10-CM | POA: Diagnosis not present

## 2023-01-06 DIAGNOSIS — G894 Chronic pain syndrome: Secondary | ICD-10-CM | POA: Diagnosis not present

## 2023-01-06 DIAGNOSIS — I10 Essential (primary) hypertension: Secondary | ICD-10-CM | POA: Diagnosis not present

## 2023-01-06 DIAGNOSIS — M5136 Other intervertebral disc degeneration, lumbar region: Secondary | ICD-10-CM | POA: Diagnosis not present

## 2023-01-06 DIAGNOSIS — E118 Type 2 diabetes mellitus with unspecified complications: Secondary | ICD-10-CM | POA: Diagnosis not present

## 2023-02-10 DIAGNOSIS — Z77098 Contact with and (suspected) exposure to other hazardous, chiefly nonmedicinal, chemicals: Secondary | ICD-10-CM | POA: Diagnosis not present

## 2023-02-10 DIAGNOSIS — M5440 Lumbago with sciatica, unspecified side: Secondary | ICD-10-CM | POA: Diagnosis not present

## 2023-02-10 DIAGNOSIS — M797 Fibromyalgia: Secondary | ICD-10-CM | POA: Diagnosis not present

## 2023-02-10 DIAGNOSIS — E118 Type 2 diabetes mellitus with unspecified complications: Secondary | ICD-10-CM | POA: Diagnosis not present

## 2023-02-10 DIAGNOSIS — Z91199 Patient's noncompliance with other medical treatment and regimen due to unspecified reason: Secondary | ICD-10-CM | POA: Diagnosis not present

## 2023-02-10 DIAGNOSIS — Z79899 Other long term (current) drug therapy: Secondary | ICD-10-CM | POA: Diagnosis not present

## 2023-02-10 DIAGNOSIS — G894 Chronic pain syndrome: Secondary | ICD-10-CM | POA: Diagnosis not present

## 2023-02-10 DIAGNOSIS — Z79891 Long term (current) use of opiate analgesic: Secondary | ICD-10-CM | POA: Diagnosis not present

## 2023-02-10 DIAGNOSIS — I10 Essential (primary) hypertension: Secondary | ICD-10-CM | POA: Diagnosis not present

## 2023-02-10 DIAGNOSIS — M51369 Other intervertebral disc degeneration, lumbar region without mention of lumbar back pain or lower extremity pain: Secondary | ICD-10-CM | POA: Diagnosis not present

## 2023-02-27 DIAGNOSIS — E119 Type 2 diabetes mellitus without complications: Secondary | ICD-10-CM | POA: Diagnosis not present

## 2023-02-27 DIAGNOSIS — Z713 Dietary counseling and surveillance: Secondary | ICD-10-CM | POA: Diagnosis not present

## 2023-02-27 DIAGNOSIS — Z9884 Bariatric surgery status: Secondary | ICD-10-CM | POA: Diagnosis not present

## 2023-02-27 DIAGNOSIS — K912 Postsurgical malabsorption, not elsewhere classified: Secondary | ICD-10-CM | POA: Diagnosis not present

## 2023-02-27 DIAGNOSIS — Z48815 Encounter for surgical aftercare following surgery on the digestive system: Secondary | ICD-10-CM | POA: Diagnosis not present

## 2023-03-10 DIAGNOSIS — E118 Type 2 diabetes mellitus with unspecified complications: Secondary | ICD-10-CM | POA: Diagnosis not present

## 2023-03-10 DIAGNOSIS — Z77098 Contact with and (suspected) exposure to other hazardous, chiefly nonmedicinal, chemicals: Secondary | ICD-10-CM | POA: Diagnosis not present

## 2023-03-10 DIAGNOSIS — M51369 Other intervertebral disc degeneration, lumbar region without mention of lumbar back pain or lower extremity pain: Secondary | ICD-10-CM | POA: Diagnosis not present

## 2023-03-10 DIAGNOSIS — Z91199 Patient's noncompliance with other medical treatment and regimen due to unspecified reason: Secondary | ICD-10-CM | POA: Diagnosis not present

## 2023-03-10 DIAGNOSIS — I10 Essential (primary) hypertension: Secondary | ICD-10-CM | POA: Diagnosis not present

## 2023-03-10 DIAGNOSIS — M5440 Lumbago with sciatica, unspecified side: Secondary | ICD-10-CM | POA: Diagnosis not present

## 2023-03-10 DIAGNOSIS — G894 Chronic pain syndrome: Secondary | ICD-10-CM | POA: Diagnosis not present

## 2023-03-10 DIAGNOSIS — Z79891 Long term (current) use of opiate analgesic: Secondary | ICD-10-CM | POA: Diagnosis not present

## 2023-03-10 DIAGNOSIS — M797 Fibromyalgia: Secondary | ICD-10-CM | POA: Diagnosis not present

## 2023-03-10 DIAGNOSIS — Z79899 Other long term (current) drug therapy: Secondary | ICD-10-CM | POA: Diagnosis not present

## 2023-04-07 DIAGNOSIS — Z91199 Patient's noncompliance with other medical treatment and regimen due to unspecified reason: Secondary | ICD-10-CM | POA: Diagnosis not present

## 2023-04-07 DIAGNOSIS — Z79891 Long term (current) use of opiate analgesic: Secondary | ICD-10-CM | POA: Diagnosis not present

## 2023-04-07 DIAGNOSIS — Z79899 Other long term (current) drug therapy: Secondary | ICD-10-CM | POA: Diagnosis not present

## 2023-04-07 DIAGNOSIS — M5440 Lumbago with sciatica, unspecified side: Secondary | ICD-10-CM | POA: Diagnosis not present

## 2023-04-07 DIAGNOSIS — G894 Chronic pain syndrome: Secondary | ICD-10-CM | POA: Diagnosis not present

## 2023-04-07 DIAGNOSIS — I10 Essential (primary) hypertension: Secondary | ICD-10-CM | POA: Diagnosis not present

## 2023-04-07 DIAGNOSIS — Z77098 Contact with and (suspected) exposure to other hazardous, chiefly nonmedicinal, chemicals: Secondary | ICD-10-CM | POA: Diagnosis not present

## 2023-04-07 DIAGNOSIS — E118 Type 2 diabetes mellitus with unspecified complications: Secondary | ICD-10-CM | POA: Diagnosis not present

## 2023-04-07 DIAGNOSIS — M51369 Other intervertebral disc degeneration, lumbar region without mention of lumbar back pain or lower extremity pain: Secondary | ICD-10-CM | POA: Diagnosis not present

## 2023-04-07 DIAGNOSIS — M797 Fibromyalgia: Secondary | ICD-10-CM | POA: Diagnosis not present

## 2023-05-07 DIAGNOSIS — I10 Essential (primary) hypertension: Secondary | ICD-10-CM | POA: Diagnosis not present

## 2023-05-07 DIAGNOSIS — M545 Low back pain, unspecified: Secondary | ICD-10-CM | POA: Diagnosis not present

## 2023-05-07 DIAGNOSIS — G894 Chronic pain syndrome: Secondary | ICD-10-CM | POA: Diagnosis not present

## 2023-05-07 DIAGNOSIS — F11288 Opioid dependence with other opioid-induced disorder: Secondary | ICD-10-CM | POA: Diagnosis not present

## 2023-05-07 DIAGNOSIS — Z79899 Other long term (current) drug therapy: Secondary | ICD-10-CM | POA: Diagnosis not present

## 2023-05-07 DIAGNOSIS — Z79891 Long term (current) use of opiate analgesic: Secondary | ICD-10-CM | POA: Diagnosis not present

## 2023-05-07 DIAGNOSIS — E118 Type 2 diabetes mellitus with unspecified complications: Secondary | ICD-10-CM | POA: Diagnosis not present

## 2023-05-07 DIAGNOSIS — M5440 Lumbago with sciatica, unspecified side: Secondary | ICD-10-CM | POA: Diagnosis not present

## 2023-05-07 DIAGNOSIS — Z91199 Patient's noncompliance with other medical treatment and regimen due to unspecified reason: Secondary | ICD-10-CM | POA: Diagnosis not present

## 2023-05-07 DIAGNOSIS — F1120 Opioid dependence, uncomplicated: Secondary | ICD-10-CM | POA: Diagnosis not present

## 2023-05-07 DIAGNOSIS — M51369 Other intervertebral disc degeneration, lumbar region without mention of lumbar back pain or lower extremity pain: Secondary | ICD-10-CM | POA: Diagnosis not present

## 2023-05-07 DIAGNOSIS — M797 Fibromyalgia: Secondary | ICD-10-CM | POA: Diagnosis not present

## 2023-05-07 DIAGNOSIS — Z77098 Contact with and (suspected) exposure to other hazardous, chiefly nonmedicinal, chemicals: Secondary | ICD-10-CM | POA: Diagnosis not present

## 2023-06-04 DIAGNOSIS — Z79891 Long term (current) use of opiate analgesic: Secondary | ICD-10-CM | POA: Diagnosis not present

## 2023-06-04 DIAGNOSIS — Z91199 Patient's noncompliance with other medical treatment and regimen due to unspecified reason: Secondary | ICD-10-CM | POA: Diagnosis not present

## 2023-06-04 DIAGNOSIS — M51369 Other intervertebral disc degeneration, lumbar region without mention of lumbar back pain or lower extremity pain: Secondary | ICD-10-CM | POA: Diagnosis not present

## 2023-06-04 DIAGNOSIS — Z77098 Contact with and (suspected) exposure to other hazardous, chiefly nonmedicinal, chemicals: Secondary | ICD-10-CM | POA: Diagnosis not present

## 2023-06-04 DIAGNOSIS — Z79899 Other long term (current) drug therapy: Secondary | ICD-10-CM | POA: Diagnosis not present

## 2023-06-04 DIAGNOSIS — M5440 Lumbago with sciatica, unspecified side: Secondary | ICD-10-CM | POA: Diagnosis not present

## 2023-06-04 DIAGNOSIS — I10 Essential (primary) hypertension: Secondary | ICD-10-CM | POA: Diagnosis not present

## 2023-06-04 DIAGNOSIS — M545 Low back pain, unspecified: Secondary | ICD-10-CM | POA: Diagnosis not present

## 2023-06-04 DIAGNOSIS — E118 Type 2 diabetes mellitus with unspecified complications: Secondary | ICD-10-CM | POA: Diagnosis not present

## 2023-06-04 DIAGNOSIS — M797 Fibromyalgia: Secondary | ICD-10-CM | POA: Diagnosis not present

## 2023-06-04 DIAGNOSIS — F1120 Opioid dependence, uncomplicated: Secondary | ICD-10-CM | POA: Diagnosis not present

## 2023-06-04 DIAGNOSIS — G894 Chronic pain syndrome: Secondary | ICD-10-CM | POA: Diagnosis not present

## 2023-06-04 DIAGNOSIS — F11288 Opioid dependence with other opioid-induced disorder: Secondary | ICD-10-CM | POA: Diagnosis not present

## 2023-06-30 DIAGNOSIS — Z48815 Encounter for surgical aftercare following surgery on the digestive system: Secondary | ICD-10-CM | POA: Diagnosis not present

## 2023-06-30 DIAGNOSIS — G4733 Obstructive sleep apnea (adult) (pediatric): Secondary | ICD-10-CM | POA: Diagnosis not present

## 2023-06-30 DIAGNOSIS — Z713 Dietary counseling and surveillance: Secondary | ICD-10-CM | POA: Diagnosis not present

## 2023-06-30 DIAGNOSIS — K912 Postsurgical malabsorption, not elsewhere classified: Secondary | ICD-10-CM | POA: Diagnosis not present

## 2023-06-30 DIAGNOSIS — E119 Type 2 diabetes mellitus without complications: Secondary | ICD-10-CM | POA: Diagnosis not present

## 2023-06-30 DIAGNOSIS — Z9884 Bariatric surgery status: Secondary | ICD-10-CM | POA: Diagnosis not present

## 2023-07-02 DIAGNOSIS — Z79899 Other long term (current) drug therapy: Secondary | ICD-10-CM | POA: Diagnosis not present

## 2023-07-02 DIAGNOSIS — G894 Chronic pain syndrome: Secondary | ICD-10-CM | POA: Diagnosis not present

## 2023-07-02 DIAGNOSIS — F1120 Opioid dependence, uncomplicated: Secondary | ICD-10-CM | POA: Diagnosis not present

## 2023-07-02 DIAGNOSIS — Z79891 Long term (current) use of opiate analgesic: Secondary | ICD-10-CM | POA: Diagnosis not present

## 2023-07-02 DIAGNOSIS — M5440 Lumbago with sciatica, unspecified side: Secondary | ICD-10-CM | POA: Diagnosis not present

## 2023-07-02 DIAGNOSIS — F11288 Opioid dependence with other opioid-induced disorder: Secondary | ICD-10-CM | POA: Diagnosis not present

## 2023-07-02 DIAGNOSIS — M797 Fibromyalgia: Secondary | ICD-10-CM | POA: Diagnosis not present

## 2023-07-02 DIAGNOSIS — Z77098 Contact with and (suspected) exposure to other hazardous, chiefly nonmedicinal, chemicals: Secondary | ICD-10-CM | POA: Diagnosis not present

## 2023-07-02 DIAGNOSIS — E118 Type 2 diabetes mellitus with unspecified complications: Secondary | ICD-10-CM | POA: Diagnosis not present

## 2023-07-02 DIAGNOSIS — Z91199 Patient's noncompliance with other medical treatment and regimen due to unspecified reason: Secondary | ICD-10-CM | POA: Diagnosis not present

## 2023-07-02 DIAGNOSIS — M545 Low back pain, unspecified: Secondary | ICD-10-CM | POA: Diagnosis not present

## 2023-07-02 DIAGNOSIS — I10 Essential (primary) hypertension: Secondary | ICD-10-CM | POA: Diagnosis not present

## 2023-07-02 DIAGNOSIS — M51369 Other intervertebral disc degeneration, lumbar region without mention of lumbar back pain or lower extremity pain: Secondary | ICD-10-CM | POA: Diagnosis not present

## 2023-07-18 NOTE — Progress Notes (Signed)
 Office Visit Note  Patient: Bonnie Fox             Date of Birth: 1965/11/26           MRN: [id number]             PCP: Eloisa Northern, MD Referring: [facility] Visit Date: 08/01/2023 Occupation: @GUAROCC @  Subjective:  Pain in multiple joints  History of Present Illness: Bonnie Fox is a 58 y.o. female with osteoarthritis and fibromyalgia syndrome.  She returns today after her last visit in April 2024.  Patient states that she continues to have pain and discomfort in her right hip which she describes over the right trochanteric bursa.  She also has discomfort in her both feet and has burning sensation in her feet.  She was placed on gabapentin by pain management which was very helpful.  She was also taken off the oxycodone and started on Subutex.  She states that the pain which Uvadex was not controlled after 8 hours.  She had only every 12 hour dosing.  She continues to have discomfort in her joints.    Activities of Daily Living:  Patient reports morning stiffness for 1-2 hours.   Patient Reports nocturnal pain.  Difficulty dressing/grooming: Denies Difficulty climbing stairs: Reports Difficulty getting out of chair: Reports Difficulty using hands for taps, buttons, cutlery, and/or writing: Denies  Review of Systems  Constitutional:  Negative for fatigue.  HENT:  Negative for mouth sores and mouth dryness.   Eyes:  Negative for dryness.  Respiratory:  Negative for shortness of breath.   Cardiovascular:  Positive for palpitations. Negative for chest pain.  Gastrointestinal:  Negative for blood in stool, constipation and diarrhea.  Endocrine: Negative for increased urination.  Genitourinary:  Negative for involuntary urination.  Musculoskeletal:  Positive for joint pain, gait problem, joint pain, myalgias, muscle weakness, morning stiffness, muscle tenderness and myalgias. Negative for joint swelling.  Skin:  Positive for rash and hair loss. Negative for  color change and sensitivity to sunlight.  Allergic/Immunologic: Negative for susceptible to infections.  Neurological:  Negative for dizziness and headaches.  Hematological:  Negative for swollen glands.  Psychiatric/Behavioral:  Negative for depressed mood and sleep disturbance. The patient is not nervous/anxious.     PMFS History:  Patient Active Problem List   Diagnosis Date Noted   Dyslipidemia associated with type 2 diabetes mellitus (HCC) 08/14/2022   Fibromyalgia 08/14/2022   Palpitations 11/18/2014   Hypokalemia 12/10/2013   Hyperlipidemia 12/10/2013   OSA (obstructive sleep apnea) 07/12/2011   OBESITY 01/02/2009   Essential hypertension 01/02/2009   FOOT, PAIN 01/02/2009    Past Medical History:  Diagnosis Date   Anxiety    Depression    Diabetes (HCC)    Fibromyalgia    Hyperlipidemia    Hypertension    Hypokalemia    OSA (obstructive sleep apnea) 07/12/2011   PSG 07/22/11>>AHI 8.9, RDI 21.8, SpO2 low 90%.  Positional and REM effect.    Palpitations    PTSD (post-traumatic stress disorder)     Family History  Problem Relation Age of Onset   Diabetes Mother    Hypertension Mother    Diabetes Father    Prostate cancer Father    Kidney disease Father    Hypertension Brother    Stroke Brother    Diabetes Brother    Healthy Daughter    Past Surgical History:  Procedure Laterality Date   BREAST REDUCTION SURGERY  CESAREAN SECTION     CHOLECYSTECTOMY     LAPAROSCOPIC GASTRIC RESTRICTIVE DUODENAL PROCEDURE (DUODENAL SWITCH)     Social History   Social History Narrative   Not on file   Immunization History  Administered Date(s) Administered   Research officer, [employer] 7yrs & up 03/08/2021   Pfizer(Comirnaty)Fall Seasonal Vaccine 12 years and older 02/11/2022, 03/04/2023     Objective: Vital Signs: BP 93/64 (BP Location: Left Arm, Patient Position: Sitting, Cuff Size: Normal)   Pulse 93   Resp 15   Ht 5' (1.524 m)   Wt 137 lb  12.8 oz (62.5 kg)   BMI 26.91 kg/m    Physical Exam Vitals and nursing note reviewed.  Constitutional:      Appearance: She is well-developed.  HENT:     Head: Normocephalic and atraumatic.  Eyes:     Conjunctiva/sclera: Conjunctivae normal.  Cardiovascular:     Rate and Rhythm: Normal rate and regular rhythm.     Heart sounds: Normal heart sounds.  Pulmonary:     Effort: Pulmonary effort is normal.     Breath sounds: Normal breath sounds.  Abdominal:     General: Bowel sounds are normal.     Palpations: Abdomen is soft.  Musculoskeletal:     Cervical back: Normal range of motion.  Lymphadenopathy:     Cervical: No cervical adenopathy.  Skin:    General: Skin is warm and dry.     Capillary Refill: Capillary refill takes less than 2 seconds.  Neurological:     Mental Status: She is alert and oriented to person, place, and time.  Psychiatric:        Behavior: Behavior normal.      Musculoskeletal Exam: Cervical, thoracic and lumbar spine were in good range of motion.  Shoulders, elbows, wrist joints, MCPs, PIPs and DIPs with good range of motion.  Mild thickening of DIP joints was noted.  No synovitis was noted.  Hip joints with good range of motion.  She had tenderness over right trochanteric bursa.  Knee joints in good range of motion without any warmth swelling or effusion.  There was no synovitis of her ankles or MTPs.  CDAI Exam: CDAI Score: -- Patient Global: --; Provider Global: -- Swollen: --; Tender: -- Joint Exam 08/01/2023   No joint exam has been documented for this visit   There is currently no information documented on the homunculus. Go to the Rheumatology activity and complete the homunculus joint exam.  Investigation: No additional findings.  Imaging: No results found.  Recent Labs: Lab Results  Component Value Date   WBC 8.6 05/20/2014   HGB 10.6 (L) 05/20/2014   PLT 345 05/20/2014   NA 136 05/20/2014   K 3.0 (L) 05/20/2014   CL 99  05/20/2014   CO2 29 05/20/2014   GLUCOSE 122 (H) 05/20/2014   BUN 16 05/20/2014   CREATININE 0.90 05/20/2014   BILITOT 0.4 12/10/2013   ALKPHOS 68 12/10/2013   AST 17 12/10/2013   ALT 17 12/10/2013   PROT 7.9 12/10/2013   ALBUMIN 4.7 12/10/2013   CALCIUM 9.1 05/20/2014   GFRAA 85 (L) 05/20/2014    Speciality Comments: No specialty comments available.  Procedures:  No procedures performed Allergies: Gabapentin, Lyrica [pregabalin], Ibuprofen, Latex, and Penicillins   Assessment / Plan:     Visit Diagnoses: Primary osteoarthritis of both hands -patient denies any discomfort in her hands today.  She states she has a stiffness at times.  No synovitis  was noted on the examination.  X-rays obtained at the initial visit were consistent with osteoarthritis.  Joint protection was discussed.  Bilateral carpal tunnel syndrome -currently not symptomatic.Marland Kitchen  Chronic pain of both hips -she had good range of motion of bilateral hip joints.  X-rays were unremarkable at the initial visit.  Trochanteric bursitis, right hip-she has been having pain over the right trochanteric bursa.  She had tenderness on palpation.  A handout on IT band stretches was given.  Patient declined cortisone injection.  Primary osteoarthritis of both feet -she complains of discomfort in her feet mostly due to neuropathy.  X-rays obtained at the initial visit were consistent with osteoarthritis.  Paresthesia of both feet-she is having progressive difficulty walking due to neuropathy.  Patient was given gabapentin by pain management.  Patient would like to be evaluated by neurologist.  I will place a referral for neurology.  Positive anti-CCP test-she had no synovitis on the examination.  No further workup is needed.  Chronic pain syndrome-she continues to have generalized pain and discomfort.  Patient states she was on oxycodone for 9 years.  She was switched from oxycodone to Subutex by pain management.  She is trying to  find a new pain management location.  She requested a referral to pain management.  I will place the referral.  Fibromyalgia - She is followed by neurologist at [facility].  She would like to establish with a neurologist locally.  She takes Cymbalta.  Benefits of daily exercise and water aerobics and swimming were discussed.  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  She had intentional weight loss.  Essential hypertension  Mixed hyperlipidemia  History of peripheral neuropathy - Followed by neurology.  Vitamin D deficiency  Frequent falls - Followed by neurology.  Anxiety and depression  PTSD (post-traumatic stress disorder)  OSA (obstructive sleep apnea)  Orders: Orders Placed This Encounter  Procedures   Ambulatory referral to Neurology   Ambulatory referral to Physical Medicine Rehab   No orders of the defined types were placed in this encounter.    Follow-Up Instructions: Return in about 1 year (around 07/31/2024) for Osteoarthritis.   Pollyann Savoy, MD  Note - This record has been created using Animal nutritionist.  Chart creation errors have been sought, but may not always  have been located. Such creation errors do not reflect on  the standard of medical care.

## 2023-07-30 DIAGNOSIS — E118 Type 2 diabetes mellitus with unspecified complications: Secondary | ICD-10-CM | POA: Diagnosis not present

## 2023-07-30 DIAGNOSIS — Z79899 Other long term (current) drug therapy: Secondary | ICD-10-CM | POA: Diagnosis not present

## 2023-07-30 DIAGNOSIS — G894 Chronic pain syndrome: Secondary | ICD-10-CM | POA: Diagnosis not present

## 2023-07-30 DIAGNOSIS — I10 Essential (primary) hypertension: Secondary | ICD-10-CM | POA: Diagnosis not present

## 2023-07-30 DIAGNOSIS — M797 Fibromyalgia: Secondary | ICD-10-CM | POA: Diagnosis not present

## 2023-07-30 DIAGNOSIS — F11288 Opioid dependence with other opioid-induced disorder: Secondary | ICD-10-CM | POA: Diagnosis not present

## 2023-07-30 DIAGNOSIS — F1120 Opioid dependence, uncomplicated: Secondary | ICD-10-CM | POA: Diagnosis not present

## 2023-07-30 DIAGNOSIS — M545 Low back pain, unspecified: Secondary | ICD-10-CM | POA: Diagnosis not present

## 2023-07-30 DIAGNOSIS — Z77098 Contact with and (suspected) exposure to other hazardous, chiefly nonmedicinal, chemicals: Secondary | ICD-10-CM | POA: Diagnosis not present

## 2023-07-30 DIAGNOSIS — M5440 Lumbago with sciatica, unspecified side: Secondary | ICD-10-CM | POA: Diagnosis not present

## 2023-07-30 DIAGNOSIS — Z79891 Long term (current) use of opiate analgesic: Secondary | ICD-10-CM | POA: Diagnosis not present

## 2023-07-30 DIAGNOSIS — M51369 Other intervertebral disc degeneration, lumbar region without mention of lumbar back pain or lower extremity pain: Secondary | ICD-10-CM | POA: Diagnosis not present

## 2023-07-30 DIAGNOSIS — Z91199 Patient's noncompliance with other medical treatment and regimen due to unspecified reason: Secondary | ICD-10-CM | POA: Diagnosis not present

## 2023-08-01 ENCOUNTER — Ambulatory Visit: Payer: Medicare Other | Attending: Rheumatology | Admitting: Rheumatology

## 2023-08-01 ENCOUNTER — Encounter: Payer: Self-pay | Admitting: Rheumatology

## 2023-08-01 VITALS — BP 93/64 | HR 93 | Resp 15 | Ht 60.0 in | Wt 137.8 lb

## 2023-08-01 DIAGNOSIS — R296 Repeated falls: Secondary | ICD-10-CM

## 2023-08-01 DIAGNOSIS — M25551 Pain in right hip: Secondary | ICD-10-CM

## 2023-08-01 DIAGNOSIS — F419 Anxiety disorder, unspecified: Secondary | ICD-10-CM

## 2023-08-01 DIAGNOSIS — M19071 Primary osteoarthritis, right ankle and foot: Secondary | ICD-10-CM | POA: Diagnosis not present

## 2023-08-01 DIAGNOSIS — M19072 Primary osteoarthritis, left ankle and foot: Secondary | ICD-10-CM

## 2023-08-01 DIAGNOSIS — G8929 Other chronic pain: Secondary | ICD-10-CM

## 2023-08-01 DIAGNOSIS — E782 Mixed hyperlipidemia: Secondary | ICD-10-CM

## 2023-08-01 DIAGNOSIS — M19042 Primary osteoarthritis, left hand: Secondary | ICD-10-CM

## 2023-08-01 DIAGNOSIS — M797 Fibromyalgia: Secondary | ICD-10-CM | POA: Diagnosis not present

## 2023-08-01 DIAGNOSIS — R768 Other specified abnormal immunological findings in serum: Secondary | ICD-10-CM

## 2023-08-01 DIAGNOSIS — G894 Chronic pain syndrome: Secondary | ICD-10-CM

## 2023-08-01 DIAGNOSIS — M7061 Trochanteric bursitis, right hip: Secondary | ICD-10-CM

## 2023-08-01 DIAGNOSIS — F4310 Post-traumatic stress disorder, unspecified: Secondary | ICD-10-CM

## 2023-08-01 DIAGNOSIS — M19041 Primary osteoarthritis, right hand: Secondary | ICD-10-CM

## 2023-08-01 DIAGNOSIS — G4733 Obstructive sleep apnea (adult) (pediatric): Secondary | ICD-10-CM

## 2023-08-01 DIAGNOSIS — R202 Paresthesia of skin: Secondary | ICD-10-CM | POA: Diagnosis not present

## 2023-08-01 DIAGNOSIS — I10 Essential (primary) hypertension: Secondary | ICD-10-CM

## 2023-08-01 DIAGNOSIS — G5603 Carpal tunnel syndrome, bilateral upper limbs: Secondary | ICD-10-CM

## 2023-08-01 DIAGNOSIS — F32A Depression, unspecified: Secondary | ICD-10-CM

## 2023-08-01 DIAGNOSIS — E559 Vitamin D deficiency, unspecified: Secondary | ICD-10-CM

## 2023-08-01 DIAGNOSIS — Z9884 Bariatric surgery status: Secondary | ICD-10-CM

## 2023-08-01 DIAGNOSIS — M25552 Pain in left hip: Secondary | ICD-10-CM

## 2023-08-01 DIAGNOSIS — Z8669 Personal history of other diseases of the nervous system and sense organs: Secondary | ICD-10-CM

## 2023-08-01 DIAGNOSIS — R7681 Abnormal rheumatoid factor and anti-citrullinated protein antibody without rheumatoid arthritis: Secondary | ICD-10-CM

## 2023-08-01 NOTE — Patient Instructions (Signed)
 Iliotibial Band Syndrome Rehab Ask your health care provider which exercises are safe for you. Do exercises exactly as told by your provider and adjust them as told. It's normal to feel mild stretching, pulling, tightness, or discomfort as you do these exercises. Stop right away if you feel sudden pain or your pain gets a lot worse. Do not begin these exercises until told by your provider. Stretching and range-of-motion exercises These exercises warm up your muscles and joints. They also improve the movement and flexibility of your hip and pelvis. Quadriceps stretch, prone  Lie face down (prone) on a firm surface like a bed or padded floor. Bend your left / right knee. Reach back to hold your ankle or pant leg. If you can't reach your ankle or pant leg, use a belt looped around your foot and grab the belt instead. Gently pull your heel toward your butt. Your knee should not slide out to the side. You should feel a stretch in the front of your thigh and knee, also called the quadriceps. Hold this position for __________ seconds. Repeat __________ times. Complete this exercise __________ times a day. Iliotibial band stretch The iliotibial band is a strip of tissue that runs along the outside of your hip down to your knee. Lie on your side with your left / right leg on top. Bend both knees and grab your left / right ankle. Stretch out your bottom arm to help you balance. Slowly bring your top knee back so your thigh goes behind your back. Slowly lower your top leg toward the floor until you feel a gentle stretch on the outside of your left / right hip and thigh. If you don't feel a stretch and your knee won't go farther, place the heel of your other foot on top of your knee and pull your knee down toward the floor with your foot. Hold this position for __________ seconds. Repeat __________ times. Complete this exercise __________ times a day. Strengthening exercises These exercises build strength  and endurance in your hip and pelvis. Endurance means your muscles can keep working even when they're tired. Straight leg raises, side-lying This exercise strengthens the muscles that rotate the leg at the hip and move it away from your body. These muscles are called hip abductors. Lie on your side with your left / right leg on top. Lie so your head, shoulder, hip, and knee line up. You can bend your bottom knee to help you balance. Roll your hips slightly forward so they're stacked directly over each other. Your left / right knee should face forward. Tense the muscles in your outer thigh and hip. Lift your top leg 4-6 inches (10-15 cm) off the ground. Hold this position for __________ seconds. Slowly lower your leg back down to the starting position. Let your muscles fully relax before doing this exercise again. Repeat __________ times. Complete this exercise __________ times a day. Leg raises, prone This exercise strengthens the muscles that move the hips backward. These muscles are called hip extensors. Lie face down (prone) on your bed or a firm surface. You can put a pillow under your hips for comfort and to support your lower back. Bend your left / right knee so your foot points straight up toward the ceiling. Keep the other leg straight and behind you. Squeeze your butt muscles. Lift your left / right thigh off the firm surface. Do not let your back arch. Tense your thigh muscle as hard as you can without having  more knee pain. Hold this position for __________ seconds. Slowly lower your leg to the starting position. Allow your leg to relax all the way. Repeat __________ times. Complete this exercise __________ times a day. Hip hike  Stand sideways on a bottom step. Place your feet so that your left / right leg is on the step, and the other foot is hanging off the side. If you need support for balance, hold onto a railing or wall. Keep your knees straight and your abdomen square,  meaning your hips are level. Then, lift your left / right hip up toward the ceiling. Slowly let your leg that's hanging off the step lower towards the floor. Your foot should get closer to the ground. Do not lean or bend your knees during this movement. Repeat __________ times. Complete this exercise __________ times a day. This information is not intended to replace advice given to you by your health care provider. Make sure you discuss any questions you have with your health care provider. Document Revised: 06/21/2022 Document Reviewed: 06/21/2022 Elsevier Patient Education  2024 [employer].

## 2023-08-04 ENCOUNTER — Encounter: Payer: Self-pay | Admitting: Neurology

## 2023-08-12 ENCOUNTER — Telehealth: Payer: Self-pay | Admitting: Rheumatology

## 2023-08-12 NOTE — Telephone Encounter (Signed)
 Spoke with patient and patient states the subutex tablets were not working properly because the tablets were not supposed to be cut and the prescription was written to take a half a tablet twice daily. Patient called Dr. Walnut Cid office and spoke with his nurse and will receive a call back tomorrow 08/13/2023 to let her know if Dr. Meredeth Stallion will be willing to reschedule her appointment due to the miscommunication. I advised patient that her referral has been faxed to [facility] but to please let me know if she does reschedule with Dr. Meredeth Stallion so I can cancel the referral to [facility]. Patient verbalized understanding.

## 2023-08-12 NOTE — Telephone Encounter (Signed)
 Pt is calling wanting to speak with someone about her medication. Pt stated she spoke to the nurse last week about this issue with her medication.

## 2023-08-13 DIAGNOSIS — G8929 Other chronic pain: Secondary | ICD-10-CM | POA: Diagnosis not present

## 2023-08-13 DIAGNOSIS — M79605 Pain in left leg: Secondary | ICD-10-CM | POA: Diagnosis not present

## 2023-08-13 DIAGNOSIS — M25551 Pain in right hip: Secondary | ICD-10-CM | POA: Diagnosis not present

## 2023-08-13 DIAGNOSIS — F1120 Opioid dependence, uncomplicated: Secondary | ICD-10-CM | POA: Diagnosis not present

## 2023-08-13 DIAGNOSIS — R9431 Abnormal electrocardiogram [ECG] [EKG]: Secondary | ICD-10-CM | POA: Diagnosis not present

## 2023-08-13 DIAGNOSIS — Z6822 Body mass index (BMI) 22.0-22.9, adult: Secondary | ICD-10-CM | POA: Diagnosis not present

## 2023-08-13 DIAGNOSIS — Z79899 Other long term (current) drug therapy: Secondary | ICD-10-CM | POA: Diagnosis not present

## 2023-08-13 DIAGNOSIS — M129 Arthropathy, unspecified: Secondary | ICD-10-CM | POA: Diagnosis not present

## 2023-08-13 DIAGNOSIS — M79604 Pain in right leg: Secondary | ICD-10-CM | POA: Diagnosis not present

## 2023-08-13 DIAGNOSIS — R110 Nausea: Secondary | ICD-10-CM | POA: Diagnosis not present

## 2023-08-13 DIAGNOSIS — R0602 Shortness of breath: Secondary | ICD-10-CM | POA: Diagnosis not present

## 2023-08-13 DIAGNOSIS — M25552 Pain in left hip: Secondary | ICD-10-CM | POA: Diagnosis not present

## 2023-08-18 DIAGNOSIS — Z79899 Other long term (current) drug therapy: Secondary | ICD-10-CM | POA: Diagnosis not present

## 2023-08-20 DIAGNOSIS — F1120 Opioid dependence, uncomplicated: Secondary | ICD-10-CM | POA: Diagnosis not present

## 2023-08-20 DIAGNOSIS — M160 Bilateral primary osteoarthritis of hip: Secondary | ICD-10-CM | POA: Diagnosis not present

## 2023-08-20 DIAGNOSIS — Z6822 Body mass index (BMI) 22.0-22.9, adult: Secondary | ICD-10-CM | POA: Diagnosis not present

## 2023-08-20 DIAGNOSIS — Z79899 Other long term (current) drug therapy: Secondary | ICD-10-CM | POA: Diagnosis not present

## 2023-08-20 DIAGNOSIS — M129 Arthropathy, unspecified: Secondary | ICD-10-CM | POA: Diagnosis not present

## 2023-08-20 DIAGNOSIS — R110 Nausea: Secondary | ICD-10-CM | POA: Diagnosis not present

## 2023-08-25 DIAGNOSIS — Z79899 Other long term (current) drug therapy: Secondary | ICD-10-CM | POA: Diagnosis not present

## 2023-09-03 DIAGNOSIS — D539 Nutritional anemia, unspecified: Secondary | ICD-10-CM | POA: Diagnosis not present

## 2023-09-03 DIAGNOSIS — R110 Nausea: Secondary | ICD-10-CM | POA: Diagnosis not present

## 2023-09-03 DIAGNOSIS — Z6822 Body mass index (BMI) 22.0-22.9, adult: Secondary | ICD-10-CM | POA: Diagnosis not present

## 2023-09-03 DIAGNOSIS — R7401 Elevation of levels of liver transaminase levels: Secondary | ICD-10-CM | POA: Diagnosis not present

## 2023-09-03 DIAGNOSIS — F1120 Opioid dependence, uncomplicated: Secondary | ICD-10-CM | POA: Diagnosis not present

## 2023-09-03 DIAGNOSIS — M160 Bilateral primary osteoarthritis of hip: Secondary | ICD-10-CM | POA: Diagnosis not present

## 2023-09-03 DIAGNOSIS — Z79899 Other long term (current) drug therapy: Secondary | ICD-10-CM | POA: Diagnosis not present

## 2023-09-05 DIAGNOSIS — Z79899 Other long term (current) drug therapy: Secondary | ICD-10-CM | POA: Diagnosis not present

## 2023-09-10 ENCOUNTER — Ambulatory Visit: Admitting: Neurology

## 2023-09-25 DIAGNOSIS — Z48815 Encounter for surgical aftercare following surgery on the digestive system: Secondary | ICD-10-CM | POA: Diagnosis not present

## 2023-09-25 DIAGNOSIS — G4733 Obstructive sleep apnea (adult) (pediatric): Secondary | ICD-10-CM | POA: Diagnosis not present

## 2023-09-25 DIAGNOSIS — Z713 Dietary counseling and surveillance: Secondary | ICD-10-CM | POA: Diagnosis not present

## 2023-09-25 DIAGNOSIS — E119 Type 2 diabetes mellitus without complications: Secondary | ICD-10-CM | POA: Diagnosis not present

## 2023-09-25 DIAGNOSIS — K912 Postsurgical malabsorption, not elsewhere classified: Secondary | ICD-10-CM | POA: Diagnosis not present

## 2023-09-25 DIAGNOSIS — Z9884 Bariatric surgery status: Secondary | ICD-10-CM | POA: Diagnosis not present

## 2023-10-03 DIAGNOSIS — Z79899 Other long term (current) drug therapy: Secondary | ICD-10-CM | POA: Diagnosis not present

## 2023-10-03 DIAGNOSIS — E538 Deficiency of other specified B group vitamins: Secondary | ICD-10-CM | POA: Diagnosis not present

## 2023-10-03 DIAGNOSIS — F1120 Opioid dependence, uncomplicated: Secondary | ICD-10-CM | POA: Diagnosis not present

## 2023-10-03 DIAGNOSIS — M160 Bilateral primary osteoarthritis of hip: Secondary | ICD-10-CM | POA: Diagnosis not present

## 2023-10-03 DIAGNOSIS — Z6822 Body mass index (BMI) 22.0-22.9, adult: Secondary | ICD-10-CM | POA: Diagnosis not present

## 2023-10-03 DIAGNOSIS — R110 Nausea: Secondary | ICD-10-CM | POA: Diagnosis not present

## 2023-10-09 DIAGNOSIS — Z79899 Other long term (current) drug therapy: Secondary | ICD-10-CM | POA: Diagnosis not present

## 2023-10-31 DIAGNOSIS — Z79899 Other long term (current) drug therapy: Secondary | ICD-10-CM | POA: Diagnosis not present

## 2023-10-31 DIAGNOSIS — Z6822 Body mass index (BMI) 22.0-22.9, adult: Secondary | ICD-10-CM | POA: Diagnosis not present

## 2023-10-31 DIAGNOSIS — E538 Deficiency of other specified B group vitamins: Secondary | ICD-10-CM | POA: Diagnosis not present

## 2023-10-31 DIAGNOSIS — G894 Chronic pain syndrome: Secondary | ICD-10-CM | POA: Diagnosis not present

## 2023-10-31 DIAGNOSIS — R110 Nausea: Secondary | ICD-10-CM | POA: Diagnosis not present

## 2023-10-31 DIAGNOSIS — M160 Bilateral primary osteoarthritis of hip: Secondary | ICD-10-CM | POA: Diagnosis not present

## 2023-11-04 ENCOUNTER — Ambulatory Visit: Admitting: Neurology

## 2023-11-05 DIAGNOSIS — Z79899 Other long term (current) drug therapy: Secondary | ICD-10-CM | POA: Diagnosis not present

## 2023-12-04 DIAGNOSIS — M160 Bilateral primary osteoarthritis of hip: Secondary | ICD-10-CM | POA: Diagnosis not present

## 2023-12-04 DIAGNOSIS — G894 Chronic pain syndrome: Secondary | ICD-10-CM | POA: Diagnosis not present

## 2023-12-04 DIAGNOSIS — Z6822 Body mass index (BMI) 22.0-22.9, adult: Secondary | ICD-10-CM | POA: Diagnosis not present

## 2023-12-04 DIAGNOSIS — Z79899 Other long term (current) drug therapy: Secondary | ICD-10-CM | POA: Diagnosis not present

## 2023-12-04 DIAGNOSIS — R110 Nausea: Secondary | ICD-10-CM | POA: Diagnosis not present

## 2023-12-04 DIAGNOSIS — E538 Deficiency of other specified B group vitamins: Secondary | ICD-10-CM | POA: Diagnosis not present

## 2023-12-08 DIAGNOSIS — Z79899 Other long term (current) drug therapy: Secondary | ICD-10-CM | POA: Diagnosis not present

## 2024-01-02 DIAGNOSIS — F1120 Opioid dependence, uncomplicated: Secondary | ICD-10-CM | POA: Diagnosis not present

## 2024-01-02 DIAGNOSIS — Z79899 Other long term (current) drug therapy: Secondary | ICD-10-CM | POA: Diagnosis not present

## 2024-01-02 DIAGNOSIS — N1832 Chronic kidney disease, stage 3b: Secondary | ICD-10-CM | POA: Diagnosis not present

## 2024-01-02 DIAGNOSIS — G894 Chronic pain syndrome: Secondary | ICD-10-CM | POA: Diagnosis not present

## 2024-01-02 DIAGNOSIS — R110 Nausea: Secondary | ICD-10-CM | POA: Diagnosis not present

## 2024-01-02 DIAGNOSIS — Z6822 Body mass index (BMI) 22.0-22.9, adult: Secondary | ICD-10-CM | POA: Diagnosis not present

## 2024-01-02 DIAGNOSIS — M160 Bilateral primary osteoarthritis of hip: Secondary | ICD-10-CM | POA: Diagnosis not present

## 2024-01-02 DIAGNOSIS — E538 Deficiency of other specified B group vitamins: Secondary | ICD-10-CM | POA: Diagnosis not present

## 2024-01-05 ENCOUNTER — Ambulatory Visit: Admitting: Neurology

## 2024-01-05 DIAGNOSIS — Z79899 Other long term (current) drug therapy: Secondary | ICD-10-CM | POA: Diagnosis not present

## 2024-02-02 DIAGNOSIS — G894 Chronic pain syndrome: Secondary | ICD-10-CM | POA: Diagnosis not present

## 2024-02-02 DIAGNOSIS — F1120 Opioid dependence, uncomplicated: Secondary | ICD-10-CM | POA: Diagnosis not present

## 2024-02-02 DIAGNOSIS — N1832 Chronic kidney disease, stage 3b: Secondary | ICD-10-CM | POA: Diagnosis not present

## 2024-02-02 DIAGNOSIS — Z79899 Other long term (current) drug therapy: Secondary | ICD-10-CM | POA: Diagnosis not present

## 2024-02-02 DIAGNOSIS — Z1231 Encounter for screening mammogram for malignant neoplasm of breast: Secondary | ICD-10-CM | POA: Diagnosis not present

## 2024-02-02 DIAGNOSIS — Z124 Encounter for screening for malignant neoplasm of cervix: Secondary | ICD-10-CM | POA: Diagnosis not present

## 2024-02-02 DIAGNOSIS — M160 Bilateral primary osteoarthritis of hip: Secondary | ICD-10-CM | POA: Diagnosis not present

## 2024-02-02 DIAGNOSIS — Z1211 Encounter for screening for malignant neoplasm of colon: Secondary | ICD-10-CM | POA: Diagnosis not present

## 2024-02-02 DIAGNOSIS — Z6821 Body mass index (BMI) 21.0-21.9, adult: Secondary | ICD-10-CM | POA: Diagnosis not present

## 2024-02-02 DIAGNOSIS — E538 Deficiency of other specified B group vitamins: Secondary | ICD-10-CM | POA: Diagnosis not present

## 2024-02-04 DIAGNOSIS — Z79899 Other long term (current) drug therapy: Secondary | ICD-10-CM | POA: Diagnosis not present

## 2024-02-09 DIAGNOSIS — Z9884 Bariatric surgery status: Secondary | ICD-10-CM | POA: Diagnosis not present

## 2024-02-09 DIAGNOSIS — K912 Postsurgical malabsorption, not elsewhere classified: Secondary | ICD-10-CM | POA: Diagnosis not present

## 2024-02-16 ENCOUNTER — Ambulatory Visit: Admitting: Neurology

## 2024-02-17 DIAGNOSIS — Z1211 Encounter for screening for malignant neoplasm of colon: Secondary | ICD-10-CM | POA: Diagnosis not present

## 2024-02-17 DIAGNOSIS — Z9884 Bariatric surgery status: Secondary | ICD-10-CM | POA: Diagnosis not present

## 2024-03-01 DIAGNOSIS — D539 Nutritional anemia, unspecified: Secondary | ICD-10-CM | POA: Diagnosis not present

## 2024-03-01 DIAGNOSIS — N1832 Chronic kidney disease, stage 3b: Secondary | ICD-10-CM | POA: Diagnosis not present

## 2024-03-01 DIAGNOSIS — Z1231 Encounter for screening mammogram for malignant neoplasm of breast: Secondary | ICD-10-CM | POA: Diagnosis not present

## 2024-03-01 DIAGNOSIS — Z6821 Body mass index (BMI) 21.0-21.9, adult: Secondary | ICD-10-CM | POA: Diagnosis not present

## 2024-03-01 DIAGNOSIS — Z1211 Encounter for screening for malignant neoplasm of colon: Secondary | ICD-10-CM | POA: Diagnosis not present

## 2024-03-01 DIAGNOSIS — G894 Chronic pain syndrome: Secondary | ICD-10-CM | POA: Diagnosis not present

## 2024-03-01 DIAGNOSIS — F1120 Opioid dependence, uncomplicated: Secondary | ICD-10-CM | POA: Diagnosis not present

## 2024-03-01 DIAGNOSIS — E538 Deficiency of other specified B group vitamins: Secondary | ICD-10-CM | POA: Diagnosis not present

## 2024-03-01 DIAGNOSIS — Z79899 Other long term (current) drug therapy: Secondary | ICD-10-CM | POA: Diagnosis not present

## 2024-03-01 DIAGNOSIS — M160 Bilateral primary osteoarthritis of hip: Secondary | ICD-10-CM | POA: Diagnosis not present

## 2024-03-03 DIAGNOSIS — Z79899 Other long term (current) drug therapy: Secondary | ICD-10-CM | POA: Diagnosis not present

## 2024-03-29 ENCOUNTER — Encounter: Payer: Self-pay | Admitting: Neurology

## 2024-03-29 ENCOUNTER — Ambulatory Visit: Admitting: Neurology

## 2024-03-31 DIAGNOSIS — M160 Bilateral primary osteoarthritis of hip: Secondary | ICD-10-CM | POA: Diagnosis not present

## 2024-03-31 DIAGNOSIS — E538 Deficiency of other specified B group vitamins: Secondary | ICD-10-CM | POA: Diagnosis not present

## 2024-03-31 DIAGNOSIS — Z758 Other problems related to medical facilities and other health care: Secondary | ICD-10-CM | POA: Diagnosis not present

## 2024-03-31 DIAGNOSIS — Z79899 Other long term (current) drug therapy: Secondary | ICD-10-CM | POA: Diagnosis not present

## 2024-03-31 DIAGNOSIS — F1120 Opioid dependence, uncomplicated: Secondary | ICD-10-CM | POA: Diagnosis not present

## 2024-03-31 DIAGNOSIS — N1832 Chronic kidney disease, stage 3b: Secondary | ICD-10-CM | POA: Diagnosis not present

## 2024-03-31 DIAGNOSIS — Z6821 Body mass index (BMI) 21.0-21.9, adult: Secondary | ICD-10-CM | POA: Diagnosis not present

## 2024-03-31 DIAGNOSIS — G894 Chronic pain syndrome: Secondary | ICD-10-CM | POA: Diagnosis not present

## 2024-03-31 DIAGNOSIS — Z1211 Encounter for screening for malignant neoplasm of colon: Secondary | ICD-10-CM | POA: Diagnosis not present

## 2024-07-30 ENCOUNTER — Ambulatory Visit: Admitting: Rheumatology
